# Patient Record
Sex: Female | Born: 1985
Health system: Southern US, Community
[De-identification: ages and names within clinical notes are randomized; demographics above are authoritative.]

## PROBLEM LIST (undated history)

## (undated) DIAGNOSIS — F419 Anxiety disorder, unspecified: Secondary | ICD-10-CM

## (undated) DIAGNOSIS — R102 Pelvic and perineal pain: Secondary | ICD-10-CM

## (undated) DIAGNOSIS — M79604 Pain in right leg: Secondary | ICD-10-CM

## (undated) DIAGNOSIS — N943 Premenstrual tension syndrome: Secondary | ICD-10-CM

## (undated) DIAGNOSIS — M79605 Pain in left leg: Secondary | ICD-10-CM

## (undated) HISTORY — DX: Pelvic and perineal pain: R10.2

## (undated) HISTORY — DX: Pain in left leg: M79.605

## (undated) HISTORY — DX: Anxiety disorder, unspecified: F41.9

## (undated) HISTORY — PX: BREAST ENHANCEMENT SURGERY: SHX7

## (undated) HISTORY — DX: Pain in right leg: M79.604

## (undated) HISTORY — DX: Premenstrual tension syndrome: N94.3

---
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
## 2006-02-26 ENCOUNTER — Ambulatory Visit (HOSPITAL_COMMUNITY): Admission: AD | Admit: 2006-02-26 | Discharge: 2006-02-26 | Payer: Self-pay | Admitting: Obstetrics and Gynecology

## 2006-03-02 ENCOUNTER — Ambulatory Visit (HOSPITAL_COMMUNITY): Admission: AD | Admit: 2006-03-02 | Discharge: 2006-03-02 | Payer: Self-pay | Admitting: Obstetrics and Gynecology

## 2006-05-13 ENCOUNTER — Inpatient Hospital Stay (HOSPITAL_COMMUNITY): Admission: AD | Admit: 2006-05-13 | Discharge: 2006-05-14 | Payer: Self-pay | Admitting: Obstetrics and Gynecology

## 2007-10-07 ENCOUNTER — Other Ambulatory Visit: Admission: RE | Admit: 2007-10-07 | Discharge: 2007-10-07 | Payer: Self-pay | Admitting: Obstetrics and Gynecology

## 2009-01-27 ENCOUNTER — Ambulatory Visit: Payer: Self-pay | Admitting: Family Medicine

## 2009-01-27 ENCOUNTER — Inpatient Hospital Stay (HOSPITAL_COMMUNITY): Admission: AD | Admit: 2009-01-27 | Discharge: 2009-01-31 | Payer: Self-pay | Admitting: Obstetrics & Gynecology

## 2009-05-10 ENCOUNTER — Other Ambulatory Visit: Admission: RE | Admit: 2009-05-10 | Discharge: 2009-05-10 | Payer: Self-pay | Admitting: Obstetrics and Gynecology

## 2010-09-17 LAB — HEPATITIS PANEL, ACUTE
Hep B C IgM: NEGATIVE
Hepatitis B Surface Ag: NEGATIVE

## 2010-09-17 LAB — COMPREHENSIVE METABOLIC PANEL
ALT: 42 U/L — ABNORMAL HIGH (ref 0–35)
ALT: 88 U/L — ABNORMAL HIGH (ref 0–35)
AST: 32 U/L (ref 0–37)
AST: 74 U/L — ABNORMAL HIGH (ref 0–37)
Albumin: 1.8 g/dL — ABNORMAL LOW (ref 3.5–5.2)
Albumin: 2.7 g/dL — ABNORMAL LOW (ref 3.5–5.2)
Alkaline Phosphatase: 161 U/L — ABNORMAL HIGH (ref 39–117)
BUN: 10 mg/dL (ref 6–23)
BUN: 11 mg/dL (ref 6–23)
BUN: 11 mg/dL (ref 6–23)
CO2: 20 mEq/L (ref 19–32)
CO2: 26 mEq/L (ref 19–32)
Calcium: 8.1 mg/dL — ABNORMAL LOW (ref 8.4–10.5)
Calcium: 8.5 mg/dL (ref 8.4–10.5)
Calcium: 8.9 mg/dL (ref 8.4–10.5)
Chloride: 108 mEq/L (ref 96–112)
Chloride: 108 mEq/L (ref 96–112)
Creatinine, Ser: 0.62 mg/dL (ref 0.4–1.2)
Creatinine, Ser: 0.82 mg/dL (ref 0.4–1.2)
GFR calc Af Amer: >60 mL/min (ref >60)
GFR calc Af Amer: >60 mL/min (ref >60)
GFR calc non Af Amer: >60 mL/min (ref >60)
GFR calc non Af Amer: >60 mL/min (ref >60)
Glucose, Bld: 77 mg/dL (ref 70–99)
Glucose, Bld: 90 mg/dL (ref 70–99)
Potassium: 3.3 mEq/L — ABNORMAL LOW (ref 3.5–5.1)
Sodium: 129 mEq/L — ABNORMAL LOW (ref 135–145)
Sodium: 137 mEq/L (ref 135–145)
Sodium: 140 mEq/L (ref 135–145)
Total Bilirubin: 0.1 mg/dL — ABNORMAL LOW (ref 0.3–1.2)
Total Bilirubin: 0.2 mg/dL — ABNORMAL LOW (ref 0.3–1.2)
Total Protein: 4.3 g/dL — ABNORMAL LOW (ref 6.0–8.3)

## 2010-09-17 LAB — HEPATIC FUNCTION PANEL
Albumin: 2.1 g/dL — ABNORMAL LOW (ref 3.5–5.2)
Indirect Bilirubin: 0.2 mg/dL — ABNORMAL LOW (ref 0.3–0.9)
Total Bilirubin: 0.3 mg/dL (ref 0.3–1.2)
Total Protein: 5.1 g/dL — ABNORMAL LOW (ref 6.0–8.3)

## 2010-09-17 LAB — CBC
HCT: 26 % — ABNORMAL LOW (ref 36.0–46.0)
HCT: 27.3 % — ABNORMAL LOW (ref 36.0–46.0)
Hemoglobin: 12.7 g/dL (ref 12.0–15.0)
Hemoglobin: 13 g/dL (ref 12.0–15.0)
Hemoglobin: 9.1 g/dL — ABNORMAL LOW (ref 12.0–15.0)
Hemoglobin: 9.4 g/dL — ABNORMAL LOW (ref 12.0–15.0)
MCHC: 34.1 g/dL (ref 30.0–36.0)
MCHC: 34.5 g/dL (ref 30.0–36.0)
MCHC: 34.9 g/dL (ref 30.0–36.0)
MCV: 95.4 fL (ref 78.0–100.0)
MCV: 96.4 fL (ref 78.0–100.0)
RBC: 2.68 MIL/uL — ABNORMAL LOW (ref 3.87–5.11)
RBC: 3.9 MIL/uL (ref 3.87–5.11)
RDW: 14.4 % (ref 11.5–15.5)
WBC: 8.9 10*3/uL (ref 4.0–10.5)
WBC: 9.7 10*3/uL (ref 4.0–10.5)

## 2010-09-17 LAB — RPR: RPR Ser Ql: NONREACTIVE

## 2010-09-17 LAB — MAGNESIUM: Magnesium: 4.5 mg/dL — ABNORMAL HIGH (ref 1.5–2.5)

## 2010-09-23 ENCOUNTER — Other Ambulatory Visit: Payer: Self-pay | Admitting: Adult Health

## 2010-09-23 ENCOUNTER — Other Ambulatory Visit (HOSPITAL_COMMUNITY)
Admission: RE | Admit: 2010-09-23 | Discharge: 2010-09-23 | Disposition: A | Discharge status: Home / Self Care | Payer: BC Managed Care – PPO | Source: Ambulatory Visit | Attending: Obstetrics and Gynecology | Admitting: Obstetrics and Gynecology

## 2010-09-23 DIAGNOSIS — Z01419 Encounter for gynecological examination (general) (routine) without abnormal findings: Secondary | ICD-10-CM | POA: Insufficient documentation

## 2010-10-25 NOTE — Op Note (Signed)
NAMEHEMA, Paige Johnson                  ACCOUNT NO.:  [id number]   MEDICAL RECORD NO.:  [id number]          PATIENT TYPE:  INP   LOCATION:  9124                          FACILITY:  [facility]   PHYSICIAN:  Catalina Antigua, MD     DATE OF BIRTH:  January 13, 1986   DATE OF PROCEDURE:  01/27/2009  DATE OF DISCHARGE:                               OPERATIVE REPORT   PREOPERATIVE DIAGNOSIS:  A 25 year old, para 1-0-2-1 at 41 weeks and 3  days for a primary cesarean section secondary to failure to progress at  8 cm, 90% phase, and -1 station.   POSTOPERATIVE DIAGNOSIS:  A 25 year old, para 1-0-2-1 at 41 weeks and 3  days for a primary cesarean section secondary to failure to progress at  8 cm, 90% phase and -1 station.   PROCEDURE:  A primary low transverse cesarean section.   SURGEON:  Catalina Antigua, MD   ASSISTANT:  Shelbie Proctor. Shawnie Pons, MD   ANESTHESIA:  Epidural.   ESTIMATED BLOOD LOSS:  600 mL.   IV FLUIDS:  2900 mL.   URINE OUTPUT:  200 mL.   FINDINGS:  Viable female infant born from OT position, Apgars 8 and 8 and  then of weight is 10 pounds 5 ounces.   COMPLICATIONS:  None.   SPECIMEN:  Placenta and it was sent to L and D for disposition.   PROCEDURE:  After informed consent was obtained, the patient was taken  to the operating room where anesthesia was induced and found to be  adequate.  The patient was prepped and draped in usual sterile fashion  and placed in dorsal spine position with leftward tilt.  A Pfannenstiel  skin incision was made with a scalpel and carried down to the underlying  layer of fascia with the Bovie.  Fascia was incised in the midline  incision and extended laterally with Mayo scissors.  The inferior aspect  of fascial incision was grasped with Kocher clamps, tented up and the  overlying rectus muscles dissected off.  Attention was then turned  superior aspect of the fascial incision in similar fashion, was grasped,  tented up with Kocher clamps and the underlying  rectus muscles dissected  off.  The rectus muscle was separated in the midline. The peritoneum  identified and entered bluntly.  This incision was extended superior and  inferiorly with good visualization of the bladder.  The eclips was  inserted into the intra-abdominal cavity.  The vesicouterine peritoneum  was identified, grasped with pickups and entered sharply with Metzenbaum  scissors.  This incision was extended laterally and the bladder flap  created digitally.  The lower uterine segment was then incised in a  transverse fashion with a scalpel.  This incision was extended laterally  with the bandage scissors.  The infant's head was delivered  atraumatically, the nose and mouth were bulb suctioned and the cord  clamped and cut.  The infant was handed to the waiting pediatrician.  The placenta was then delivered via gentle uterine massage.  The uterus  was cleared of all clots and debris.  The uterine  incision was repaired  with 0 Vicryl in a running locked fashion.  Excellent hemostasis was  noted.  The gutters were cleared of all clots and debris.  Copious  irrigation of the intra-abdominal cavity was then performed.  The eclips  was removed from the abdominal cavity. The fascia was reapproximated  with 0 Vicryl in a running fashion and then skin was closed with  staples.  The patient tolerated the procedure well.  Sponge, lap, and  needle count were correct x2.  The patient was transferred to recovery  room in stable condition.      Catalina Antigua, MD  Electronically Signed     PC/MEDQ  D:  01/28/2009  T:  01/29/2009  Job:  [id number]

## 2010-10-28 NOTE — Op Note (Signed)
NAMERoyston Cowper Johnson                ACCOUNT NO.:  [id number]   MEDICAL RECORD NO.:  [id number]          PATIENT TYPE:  INP   LOCATION:  A401                          FACILITY:  [facility]   PHYSICIAN:  Lazaro Arms, M.D.   DATE OF BIRTH:  10/13/1985   DATE OF PROCEDURE:  05/13/2006  DATE OF DISCHARGE:                               OPERATIVE REPORT   PROCEDURE PERFORMED:  Epidural catheter placement.   INDICATIONS FOR PROCEDURE:  Paige Johnson is a 25 year old white female gravida  3, para 0, abortus 2, estimated date of delivery of May 24, 2006  currently at 38-1/[redacted] weeks gestation, who presented to labor and delivery  complaining of regular contractions and rupture of membranes.  Rupture  of membranes was confirmed.  She was at fingertip, 50% and -2.  She has  progressed now to 4 cm, completely thinned out and 0 station.  She has  had two doses of IV pain medicine and is requesting an epidural be  placed.   Paige Johnson is placed in sitting position.  Betadine prep was used, 1%  lidocaine was injected into the L3-L4 interspace as local anesthetic.  The area is field draped.  A 17 gauge Tuohy needle was used.  The loss  of resistance technique is employed and the epidural space is found with  one pass without difficulty.  10 mL of 0.125% bupivacaine plain was  given as a test dose without ill effects.  The epidural catheter was  then fed without difficulty into the epidural space.  It was taped down  5 cm in the epidural space.  An additional 10 mL of 0.125% bupivacaine  plain was given and the continuous infusion was begun at 12 mL an hour.  The patient tolerated the procedure well. She is experiencing no adverse  symptoms.  Fetal heart rate tracing is stable.      Lazaro Arms, M.D.  Electronically Signed     LHE/MEDQ  D:  05/13/2006  T:  05/14/2006  Job:  [id number]

## 2010-10-28 NOTE — Op Note (Signed)
NAMERoyston Johnson Stefani                ACCOUNT NO.:  [id number]   MEDICAL RECORD NO.:  [id number]          PATIENT TYPE:  INP   LOCATION:  A401                          FACILITY:  [facility]   PHYSICIAN:  Tilda Burrow, M.D. DATE OF BIRTH:  [birth year]   DATE OF PROCEDURE:  05/13/2006  DATE OF DISCHARGE:                               OPERATIVE REPORT   OPERATION PERFORMED:  Vaginal delivery, 11:05 a.m.   LABOR SUMMARY:  Paige Johnson progressed nicely after Dr. Forestine Chute epidural.  She _ progressed over an hour to completely dilated and crowning.  I  performed perineal massage to stretch the perineal tissues and then she  was able to expel the baby over a small first degree perineal laceration  that was irregular, delivering a 7 pound, 9 ounce female, Apgars 9 and  9.  There was no nuchal cord.   The left shoulder was somewhat impacted under the symphysis pubis but  the right shoulder had already been released into the posterior vaginal  vault.  Corkscrew maneuver was performed spinning the baby around and  releasing the right arm first.  The delivery was easily accomplished  delivering a healthy 7 pound 9 ounce female, Apgars 9 and 9.  Placenta  cord was clamped and baby placed on maternal abdomen, then subsequently  dried under the warmer.  The patient had easy expulsion of the placenta  within moments of the delivery of the baby, with intact cord and  placenta and membranes.  The estimated blood 250 mL.  A single stitch of  2-0 Vicryl was placed in the posterior fourchette where the small first  degree laceration occurred and some irregular tissues from the  hymen  remnants were trimmed.  The patient tolerated the procedure well.  There  were four people in the delivery room.      Tilda Burrow, M.D.  Electronically Signed     JVF/MEDQ  D:  05/13/2006  T:  05/14/2006  Job:  [id number]

## 2010-10-28 NOTE — Discharge Summary (Signed)
NAMERoyston Cowper Johnson                ACCOUNT NO.:  [id number]   MEDICAL RECORD NO.:  [id number]          PATIENT TYPE:  OIB   LOCATION:  LDR2                          [facility]ILITY:  [facility]   PHYSICIAN:  Tilda Burrow, M.D. DATE OF BIRTH:  Apr 09, 1986   DATE OF ADMISSION:  02/26/2006  DATE OF DISCHARGE:  09/17/2007LH                                 DISCHARGE SUMMARY   REASON FOR ADMISSION:  Pregnancy at 27 weeks with low abdominal pain.   HISTORY OF PRESENT ILLNESS:  Paige Johnson is a 25 year old, G3, P0 who presents  with lower abdominal pain since last p.m.   PAST MEDICAL HISTORY:  Negative.   PAST SURGICAL HISTORY:  Ectopic pregnancy.   ALLERGIES:  No known drug allergies.   PRENATAL COURSE:  Essentially uneventful up until this point.   PRENATAL LABORATORY DATA:  Urine shows positive nitrites, positive wbc's.  There is no contraction pattern noted or palpated on the electronic fetal  monitor.   PHYSICAL EXAMINATION:  PELVIC:  Cervix is closed, posterior and high.   PLAN:  We are going to discharge her home on Macrobid b.i.d. x10 days and  Lortab 5/500 for the discomfort.  She knows if she has any regular  contractions or cramping, she is to return to see [place] or come back to the  hospital.      Paige Johnson, N.M.      Tilda Burrow, M.D.  Electronically Signed    DL/MEDQ  D:  16/03/9603  T:  02/26/2006  Job:  [id number]   cc:   [facility]

## 2010-10-28 NOTE — H&P (Signed)
NAMERoyston Cowper Tess                ACCOUNT NO.:  [id number]   MEDICAL RECORD NO.:  [id number]          PATIENT TYPE:  INP   LOCATION:  LDR2                          FACILITY:  [facility]   PHYSICIAN:  Lazaro Arms, M.D.   DATE OF BIRTH:  05/14/1986   DATE OF ADMISSION:  05/13/2006  DATE OF DISCHARGE:  LH                              HISTORY & PHYSICAL   Paige Johnson is a 24 year old white female gravida 3, para 0, abortus 2,  estimated date of delivery of May 24, 2006, currently 38-1/[redacted] weeks  gestation.  She is a transfer from Dr. __________ at 24 weeks.  We  initially saw her on February 05, 2006.  Her antepartum course has been  unremarkable and she presents complaining of regular uterine  contractions and ruptured membranes.  Ruptured membranes was confirmed.  She is fingertip at time of admission and has now progressed to 4 cm.   PAST MEDICAL HISTORY:  Negative.   PAST SURGICAL HISTORY:  She had an ectopic pregnancy.   ALLERGIES:  NONE.   MEDICATIONS:  Prenatal vitamins.   REVIEW OF SYSTEMS:  Negative.  She is A positive, Rubella immune.  Hepatitis B is negative, HIV is nonreactive, HSV is negative, serology  is nonreactive x2.  Pap is normal.  GC and Chlamydia were normal and  repeat was as well.  Group B strep was negative.  AFP was normal,  Glucola was 99.   IMPRESSION:  1. Intrauterine pregnancy at 38-1/[redacted] weeks gestation.  2. Ruptured membranes with labor.   PLAN:  Patient is admitted for labor management.  She is requesting an  epidural.      Lazaro Arms, M.D.  Electronically Signed     LHE/MEDQ  D:  05/13/2006  T:  05/13/2006  Job:  [id number]

## 2010-10-28 NOTE — Op Note (Signed)
NAMERoyston Cowper Johnson                ACCOUNT NO.:  [id number]   MEDICAL RECORD NO.:  [id number]          PATIENT TYPE:  MAT   LOCATION:  MATC                          [facility]   PHYSICIAN:  Paige Johnson, M.D.  DATE OF BIRTH:  30-Aug-1985   DATE OF PROCEDURE:  12/08/2004  DATE OF DISCHARGE:                                 OPERATIVE REPORT   PREOPERATIVE DIAGNOSIS:  Right lower quadrant pain, probable ectopic  pregnancy.   POSTOPERATIVE DIAGNOSIS:  Right ectopic pregnancy, unruptured.   PROCEDURE:  Open laparoscopy, right salpingostomy.   SURGEON:  Dr. Marina Johnson   ASSISTANT:  Dr. Richardean Johnson   FINDINGS:  Unruptured right mid tubal pregnancy hemoperitoneum.   ANESTHESIA:  General.   SPECIMENS:  Products of conception.   COMPLICATIONS:  None.   BLOOD LOSS:  Minimal, although approximately 75 mL of blood in the pelvis at  the time of initial entry.   INDICATION:  Patient with a recent history of first trimester bleeding,  proved to be Rh positive in the office.  Initially her quantitative hCG  level rose appropriately; however, the most recent two had shown an  inappropriate rise at less than 50%.  Her most recent was 435 today and two  days prior was 344.  Ultrasound showed a right adnexal mass and blood in the  pelvis which appeared to be a substantial amount.  The patient was found to  be hemodynamically stable without signs of anemia; however, given her  increasing pain and the ultrasound findings, this was worrisome for a  potentially ruptured ectopic pregnancy or at least one with significant  bleeding that would therefore make her not a good candidate for  methotrexate.  The patient was advised of the risks of surgery, including  infection, bleeding, damage to surrounding organs, and potential need for  tubal removal or laparotomy.   DESCRIPTION OF PROCEDURE:  The patient was taken to the operating room and  general anesthesia obtained.  She was prepped  and draped in a standard  fashion, had Foley catheter placed in the bladder, sponge-stick placed in  the vagina.   A 10 mm incision placed in the umbilicus transversely.  Sharply dissected to  the fascia.  The fascia was divided sharply and elevated with the Kocher  clamps.  Posterior sheath and peritoneum were elevated and entered sharply.  Pursestring suture of 0 Vicryl placed around the fascial defect, a Hasson  cannula inserted and secured.   Pneumoperitoneum obtained with CO2 gas.  Trendelenberg position obtained.  Then 5 mm ports were placed in each lower quadrant under direct laparoscopic  visualization.  Pelvis was inspected and the above findings noted.  The  pelvis was irrigated to evacuate the hemoperitoneum.  There was no active  bleeding.   The tube itself appeared normal on the right, although there was an obvious  swelling in its mid portion consistent with an ectopic pregnancy.  The  fimbria appeared normal as did the ovary as did the left tube and ovary,  uterus, anterior and posterior cul-de-sac, and upper abdomen.  There were  no  signs of any type of scarring or other pathology.   The right tube was grasped and placed against the uterus.  The  antimesenteric border was entered with the needle point cautery for linear  salpingostomy.  The ectopic pregnancy tissue was grasped and removed.  Hydrodissection performed.  No additional material could be removed from the  tubial ostia; however, at the fimbria, it was noted there was some  additional tissue which flushed out during the hydrodissection which was  also removed.   The salpingostomy was made hemostatic with bipolar cautery.   No other abnormalities were noted, and the procedure was therefore  terminated.  The inferior ports were removed and their sites inspected  laparoscopically and found to be hemostatic.   The scope was removed, gas released, and Hasson cannula removed.  The  incision was elevated with  [employer] retractors and the fascial defect  closed with the previously-placed pursestring suture.  The skin was closed  at each lower port with Dermabond.  The umbilical incision could not be  closed with Dermabond, as there a small amount of bleeding from the skin  edge.  It was therefore closed with a running subcuticular stitch of 4-0  Vicryl.   The patient tolerated the procedure well with no complications.  She was  taken to the recovery room in a stable condition.  The patient will be  advised in the recovery room that she will need to have follow up  quantitative hCGs to ensure complete resolution of the ectopic pregnancy.  Arrangements will be made for this as an outpatient in our office.       WBD/MEDQ  D:  12/08/2004  T:  12/08/2004  Job:  [id number]

## 2013-03-10 ENCOUNTER — Ambulatory Visit (INDEPENDENT_AMBULATORY_CARE_PROVIDER_SITE_OTHER): Payer: BC Managed Care – PPO | Admitting: Obstetrics & Gynecology

## 2013-03-10 ENCOUNTER — Encounter: Payer: Self-pay | Admitting: Obstetrics & Gynecology

## 2013-03-10 VITALS — BP 110/60 | Ht 65.0 in | Wt 167.5 lb

## 2013-03-10 DIAGNOSIS — M545 Low back pain, unspecified: Secondary | ICD-10-CM

## 2013-03-10 DIAGNOSIS — M5380 Other specified dorsopathies, site unspecified: Secondary | ICD-10-CM

## 2013-03-10 DIAGNOSIS — M62830 Muscle spasm of back: Secondary | ICD-10-CM | POA: Insufficient documentation

## 2013-03-10 LAB — POCT URINALYSIS DIPSTICK
Blood, UA: NEGATIVE
Glucose, UA: NEGATIVE
Nitrite, UA: NEGATIVE

## 2013-03-10 MED ORDER — CYCLOBENZAPRINE HCL 10 MG PO TABS: 10.0000 mg | ORAL_TABLET | Freq: Three times a day (TID) | ORAL | Status: DC | PRN

## 2013-03-10 NOTE — Progress Notes (Signed)
Patient ID: Paige Johnson, female   DOB: 12-12-1985, 27 y.o.   MRN: [id number] Pt with significant paraspinous muscle spasm on left, originating around the post ischial spine Also left rhomboid and trapezius  Injected with 0.5 %marcaine with much relief Recommend new pillow and  Follow up in 1 week

## 2013-03-17 ENCOUNTER — Ambulatory Visit (INDEPENDENT_AMBULATORY_CARE_PROVIDER_SITE_OTHER): Payer: BC Managed Care – PPO | Admitting: Obstetrics & Gynecology

## 2013-03-17 ENCOUNTER — Encounter: Payer: Self-pay | Admitting: Obstetrics & Gynecology

## 2013-03-17 VITALS — BP 114/70 | Wt 164.0 lb

## 2013-03-17 DIAGNOSIS — M62830 Muscle spasm of back: Secondary | ICD-10-CM

## 2013-03-17 DIAGNOSIS — M545 Low back pain, unspecified: Secondary | ICD-10-CM

## 2013-03-17 DIAGNOSIS — M5380 Other specified dorsopathies, site unspecified: Secondary | ICD-10-CM

## 2013-03-17 NOTE — Progress Notes (Signed)
Patient ID: Paige Johnson, female   DOB: 1985-10-05, 27 y.o.   MRN: [id number] Pt is much much better No triggers today  Follow up prn

## 2013-08-28 ENCOUNTER — Other Ambulatory Visit: Payer: BC Managed Care – PPO | Admitting: Adult Health

## 2013-09-04 ENCOUNTER — Ambulatory Visit (INDEPENDENT_AMBULATORY_CARE_PROVIDER_SITE_OTHER): Payer: BC Managed Care – PPO | Admitting: Adult Health

## 2013-09-04 ENCOUNTER — Other Ambulatory Visit (HOSPITAL_COMMUNITY)
Admission: RE | Admit: 2013-09-04 | Discharge: 2013-09-04 | Disposition: A | Discharge status: Home / Self Care | Payer: BC Managed Care – PPO | Source: Ambulatory Visit | Attending: Obstetrics and Gynecology | Admitting: Obstetrics and Gynecology

## 2013-09-04 ENCOUNTER — Encounter (INDEPENDENT_AMBULATORY_CARE_PROVIDER_SITE_OTHER): Payer: Self-pay

## 2013-09-04 ENCOUNTER — Encounter: Payer: Self-pay | Admitting: Adult Health

## 2013-09-04 VITALS — BP 94/60 | HR 76 | Ht 65.0 in | Wt 164.0 lb

## 2013-09-04 DIAGNOSIS — Z01419 Encounter for gynecological examination (general) (routine) without abnormal findings: Secondary | ICD-10-CM

## 2013-09-04 NOTE — Progress Notes (Signed)
Patient ID: Paige Johnson, female   DOB: December 16, 1985, 28 y.o.   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   Current Medications, Allergies, Past Medical History, Past Surgical History, Family History and Social History were reviewed in [facility] electronic medical record.     Review of Systems: Patient denies any headaches, blurred vision, shortness of breath, chest pain, abdominal pain, problems with bowel movements, urination, or intercourse. Does have some pressure with sex, discussed increase foreplay and position change and lubrication.No joint pain, does have trouble focusing at times.    Physical Exam:BP 94/60  Pulse 76  Ht 5\' 5"  (1.651 m)  Wt 164 lb (74.39 kg)  BMI 27.29 kg/m2  LMP 08/26/2013 General:  Well developed, well nourished, no acute distress Skin:  Warm and dry Neck:  Midline trachea, normal thyroid Lungs; Clear to auscultation bilaterally Breast:  No dominant palpable mass, retraction, or nipple discharge Cardiovascular: Regular rate and rhythm Abdomen:  Soft, non tender, no hepatosplenomegaly Pelvic:  External genitalia is normal in appearance.  The vagina is normal in appearance.  The cervix is bulbous. Pap performed. Uterus is felt to be normal size, shape, and contour.  No  adnexal masses or tenderness noted. Extremities:  No swelling or varicosities noted Psych:  No mood changes, alert and cooperative,seems happy   Impression: Yearly gyn exam    Plan: Physical in 1 year Get flu shot and TB at PCP

## 2013-09-04 NOTE — Patient Instructions (Signed)
Physical in 1 year Get flu shot and TB

## 2013-09-25 ENCOUNTER — Encounter: Payer: BC Managed Care – PPO | Admitting: Nurse Practitioner

## 2013-10-01 ENCOUNTER — Ambulatory Visit (INDEPENDENT_AMBULATORY_CARE_PROVIDER_SITE_OTHER): Payer: BC Managed Care – PPO | Admitting: Nurse Practitioner

## 2013-10-01 ENCOUNTER — Encounter: Payer: Self-pay | Admitting: Nurse Practitioner

## 2013-10-01 VITALS — BP 112/80 | Ht 65.0 in | Wt 162.0 lb

## 2013-10-01 DIAGNOSIS — Z0189 Encounter for other specified special examinations: Secondary | ICD-10-CM

## 2013-10-01 DIAGNOSIS — Z111 Encounter for screening for respiratory tuberculosis: Secondary | ICD-10-CM

## 2013-10-01 DIAGNOSIS — J310 Chronic rhinitis: Secondary | ICD-10-CM

## 2013-10-01 DIAGNOSIS — J329 Chronic sinusitis, unspecified: Secondary | ICD-10-CM

## 2013-10-01 MED ORDER — AZITHROMYCIN 250 MG PO TABS: ORAL_TABLET | ORAL | Status: DC

## 2013-10-02 LAB — VARICELLA ZOSTER ANTIBODY, IGG: Varicella IgG: 528.5 Index — ABNORMAL HIGH (ref <135.00)

## 2013-10-03 ENCOUNTER — Ambulatory Visit (INDEPENDENT_AMBULATORY_CARE_PROVIDER_SITE_OTHER): Payer: BC Managed Care – PPO | Admitting: Nurse Practitioner

## 2013-10-03 ENCOUNTER — Encounter: Payer: Self-pay | Admitting: Nurse Practitioner

## 2013-10-03 VITALS — BP 110/70 | Ht 65.0 in | Wt 162.0 lb

## 2013-10-03 DIAGNOSIS — Z79899 Other long term (current) drug therapy: Secondary | ICD-10-CM

## 2013-10-03 DIAGNOSIS — F988 Other specified behavioral and emotional disorders with onset usually occurring in childhood and adolescence: Secondary | ICD-10-CM

## 2013-10-03 LAB — TB SKIN TEST
Induration: 0 mm
TB Skin Test: NEGATIVE

## 2013-10-03 MED ORDER — AMPHETAMINE-DEXTROAMPHETAMINE 10 MG PO TABS: ORAL_TABLET | ORAL | Status: DC

## 2013-10-05 ENCOUNTER — Encounter: Payer: Self-pay | Admitting: Nurse Practitioner

## 2013-10-05 DIAGNOSIS — F988 Other specified behavioral and emotional disorders with onset usually occurring in childhood and adolescence: Secondary | ICD-10-CM | POA: Insufficient documentation

## 2013-10-05 NOTE — Progress Notes (Signed)
Subjective:  Presents with paperwork for her physical at school. Complaints of sinus pressure, head congestion with occasional cough. Producing yellowish mucus. No fever. No sore throat. No ear pain. No wheezing. Had a preventive health physical on 3/26.   Objective:   BP 112/80  Ht 5\' 5"  (1.651 m)  Wt 162 lb (73.483 kg)  BMI 26.96 kg/m2  LMP 09/22/2013 NAD. Alert, oriented. TMs clear effusion, no erythema. Pharynx injected with greenish PND noted. Neck supple with mild soft anterior adenopathy. Lungs clear. Heart regular rate rhythm. See physical form for remainder of exam.  Assessment: Rhinosinusitis  Visit for TB skin test - Plan: TB Skin Test  Other specified examination - Plan: Varicella zoster antibody, IgG, CANCELED: Varicella Zoster Abs, IgG/IgM, CANCELED: Varicella Zoster Abs, IgG/IgM  Plan: Z-Pak as directed. OTC meds as directed for congestion. At end of visit patient mentions that she's been having some significant difficulty focusing which has been a long-term problem. Given a copy of ADD questionnaire for her to fill out at home and schedule visit to discuss further.

## 2013-10-05 NOTE — Progress Notes (Addendum)
Subjective:  Presents to discuss symptoms of ADD. Has had problems since childhood. Extreme difficulty focusing, easily distracted. Currently in school, her teachers have noticed her difficulty focusing and made comments. Works a half a day, goes to school in the evenings. Near the end of May patient will begin her clinicals which will go all day. No history of any heart issues.  Objective:   BP 110/70  Ht 5\' 5"  (1.651 m)  Wt 162 lb (73.483 kg)  BMI 26.96 kg/m2  LMP 09/22/2013 NAD. Alert, oriented. Lungs clear. Heart regular rhythm. ECG normal.  Assessment: Problem List Items Addressed This Visit   None    Visit Diagnoses   ADD (attention deficit disorder)    -  Primary    Relevant Medications       amphetamine-dextroamphetamine (ADDERALL) tablet    Other Relevant Orders       PR ELECTROCARDIOGRAM, COMPLETE    High risk medication use        Relevant Orders       PR ELECTROCARDIOGRAM, COMPLETE       Plan: Meds ordered this encounter  Medications  . amphetamine-dextroamphetamine (ADDERALL) 10 MG tablet    Sig: One po qd; do not take after 4 pm    Dispense:  30 tablet    Refill:  0    Order Specific Question:  Supervising Provider    Answer:  Merlyn AlbertLUKING, WILLIAM S [[id number]]  trial of Adderall 10 mg in the afternoon. Discussed potential adverse effects. DC med and call if any problems. May need to change regimen once she begins her clinicals. Return in about 1 month (around 11/02/2013). Call back sooner if any problems.

## 2013-10-08 ENCOUNTER — Ambulatory Visit: Payer: BC Managed Care – PPO

## 2013-10-15 ENCOUNTER — Ambulatory Visit (INDEPENDENT_AMBULATORY_CARE_PROVIDER_SITE_OTHER): Payer: BC Managed Care – PPO

## 2013-10-15 DIAGNOSIS — Z111 Encounter for screening for respiratory tuberculosis: Secondary | ICD-10-CM

## 2013-10-17 LAB — TB SKIN TEST: TB SKIN TEST: NEGATIVE

## 2013-10-28 ENCOUNTER — Encounter: Payer: Self-pay | Admitting: Nurse Practitioner

## 2013-10-29 ENCOUNTER — Encounter: Payer: Self-pay | Admitting: Nurse Practitioner

## 2013-10-29 ENCOUNTER — Ambulatory Visit (INDEPENDENT_AMBULATORY_CARE_PROVIDER_SITE_OTHER): Payer: BC Managed Care – PPO | Admitting: Nurse Practitioner

## 2013-10-29 VITALS — BP 116/74 | Ht 65.0 in | Wt 164.0 lb

## 2013-10-29 DIAGNOSIS — F988 Other specified behavioral and emotional disorders with onset usually occurring in childhood and adolescence: Secondary | ICD-10-CM

## 2013-10-29 DIAGNOSIS — J310 Chronic rhinitis: Secondary | ICD-10-CM

## 2013-10-29 MED ORDER — AMPHETAMINE-DEXTROAMPHET ER 15 MG PO CP24: 15.0000 mg | ORAL_CAPSULE | ORAL | Status: DC

## 2013-10-29 MED ORDER — AMPHETAMINE-DEXTROAMPHETAMINE 10 MG PO TABS: ORAL_TABLET | ORAL | Status: DC

## 2013-10-29 NOTE — Patient Instructions (Signed)
OTC antihistamine as directed (Loratadine or fexofenadine) Nasacort AQ as directed

## 2013-10-31 ENCOUNTER — Ambulatory Visit: Payer: BC Managed Care – PPO | Admitting: Nurse Practitioner

## 2013-11-02 ENCOUNTER — Encounter: Payer: Self-pay | Admitting: Nurse Practitioner

## 2013-11-02 NOTE — Progress Notes (Signed)
Subjective:  Presents for recheck. ADD improving. Will begin clinical in a few weeks which lasts 8-5. Denies any adverse effects from Adderall. No CP, SOB, palpitations or insomnia. Also mild head congestion. Rare cough. No fever. Clear drainage. No sore throat, headache or ear pain.   Objective:   BP 116/74  Ht 5\' 5"  (1.651 m)  Wt 164 lb (74.39 kg)  BMI 27.29 kg/m2  LMP 09/22/2013 NAD. Alert, oriented. TMs clear effusion. Pharynx injected with clear PND. Neck supple with mild soft adenopathy. Lungs clear. Heart RRR.  Assessment:  Problem List Items Addressed This Visit     Other   ADD (attention deficit disorder) - Primary   Relevant Medications      amphetamine-dextroamphetamine (ADDERALL) tablet    Other Visit Diagnoses   Rhinitis          Plan:  Given 3 separate monthly prescriptions for Adderall XR 15 mg and adderall 10 mg. Recheck in 3 months, call back sooner if problems.  OTC meds as directed for congestion.

## 2014-04-13 ENCOUNTER — Encounter: Payer: Self-pay | Admitting: Nurse Practitioner

## 2014-08-31 ENCOUNTER — Other Ambulatory Visit: Payer: Self-pay | Admitting: Nurse Practitioner

## 2014-08-31 MED ORDER — ESCITALOPRAM OXALATE 10 MG PO TABS: 10.0000 mg | ORAL_TABLET | Freq: Every day | ORAL | Status: DC

## 2014-08-31 NOTE — Progress Notes (Signed)
Has stopped Adderall due to palpitations. Wants to try med for anxiety. No relief with Celexa in the past. In with daughter today. To schedule office visit in the next few weeks. DC Lexapro in the meantime and call if any problems.

## 2015-08-19 ENCOUNTER — Encounter: Payer: Self-pay | Admitting: Adult Health

## 2015-08-19 ENCOUNTER — Ambulatory Visit (INDEPENDENT_AMBULATORY_CARE_PROVIDER_SITE_OTHER): Payer: BLUE CROSS/BLUE SHIELD | Admitting: Adult Health

## 2015-08-19 VITALS — BP 110/60 | HR 66 | Ht 65.0 in | Wt 167.5 lb

## 2015-08-19 DIAGNOSIS — N63 Unspecified lump in breast: Secondary | ICD-10-CM

## 2015-08-19 DIAGNOSIS — N6310 Unspecified lump in the right breast, unspecified quadrant: Secondary | ICD-10-CM

## 2015-08-19 NOTE — Patient Instructions (Signed)
[facility] be there at 10:15 am  See me 3/20 for physical

## 2015-08-19 NOTE — Progress Notes (Signed)
Subjective:     Patient ID: Paige Johnson, female   DOB: 1986/02/12, 30 y.o.   MRN: [id number]  HPI Paige Johnson is a 30 year old white female in complaining of right breast mass for about 3 months, it is tender and seems smaller since period started.   Review of Systems +breast mass on right that is tender Patient denies any  Daily headaches, hearing loss, blurred vision, shortness of breath, chest pain, abdominal pain, problems with bowel movements, urination, or intercourse. No joint pain or mood swings.+fatigue and back and legs hurt with period has appt 3/20 for physical and to discuss further Reviewed past medical,surgical, social and family history. Reviewed medications and allergies.     Objective:   Physical Exam BP 110/60 mmHg  Pulse 66  Ht 5\' 5"  (1.651 m)  Wt 167 lb 8 oz (75.978 kg)  BMI 27.87 kg/m2  LMP 08/18/2015   Skin warm and dry,  Breasts:no dominate palpable mass, retraction or nipple discharge on left, on right, no retraction or nipple discharge, but has oval, mobile,tender mass at 11-1 0'clock, 2 FB from nipple    Assessment:     Right breast mass    Plan:     [facility] am Return 3/20 as scheduled for pap and physical

## 2015-08-24 ENCOUNTER — Ambulatory Visit (HOSPITAL_COMMUNITY)
Admission: RE | Admit: 2015-08-24 | Discharge: 2015-08-24 | Disposition: A | Discharge status: Home / Self Care | Payer: BLUE CROSS/BLUE SHIELD | Source: Ambulatory Visit | Attending: Adult Health | Admitting: Adult Health

## 2015-08-24 DIAGNOSIS — N6310 Unspecified lump in the right breast, unspecified quadrant: Secondary | ICD-10-CM

## 2015-08-24 DIAGNOSIS — N63 Unspecified lump in breast: Secondary | ICD-10-CM | POA: Diagnosis present

## 2015-08-30 ENCOUNTER — Encounter: Payer: Self-pay | Admitting: Adult Health

## 2015-08-30 ENCOUNTER — Ambulatory Visit (INDEPENDENT_AMBULATORY_CARE_PROVIDER_SITE_OTHER): Payer: BLUE CROSS/BLUE SHIELD | Admitting: Adult Health

## 2015-08-30 ENCOUNTER — Other Ambulatory Visit (HOSPITAL_COMMUNITY)
Admission: RE | Admit: 2015-08-30 | Discharge: 2015-08-30 | Disposition: A | Discharge status: Home / Self Care | Payer: BLUE CROSS/BLUE SHIELD | Source: Ambulatory Visit | Attending: Adult Health | Admitting: Adult Health

## 2015-08-30 VITALS — BP 112/72 | HR 64 | Ht 65.25 in | Wt 165.0 lb

## 2015-08-30 DIAGNOSIS — Z1151 Encounter for screening for human papillomavirus (HPV): Secondary | ICD-10-CM | POA: Insufficient documentation

## 2015-08-30 DIAGNOSIS — Z01419 Encounter for gynecological examination (general) (routine) without abnormal findings: Secondary | ICD-10-CM | POA: Insufficient documentation

## 2015-08-30 DIAGNOSIS — N943 Premenstrual tension syndrome: Secondary | ICD-10-CM

## 2015-08-30 DIAGNOSIS — R102 Pelvic and perineal pain: Secondary | ICD-10-CM | POA: Insufficient documentation

## 2015-08-30 DIAGNOSIS — M79605 Pain in left leg: Secondary | ICD-10-CM

## 2015-08-30 DIAGNOSIS — M79604 Pain in right leg: Secondary | ICD-10-CM

## 2015-08-30 DIAGNOSIS — F419 Anxiety disorder, unspecified: Secondary | ICD-10-CM

## 2015-08-30 HISTORY — DX: Pain in left leg: M79.605

## 2015-08-30 HISTORY — DX: Premenstrual tension syndrome: N94.3

## 2015-08-30 HISTORY — DX: Anxiety disorder, unspecified: F41.9

## 2015-08-30 HISTORY — DX: Pain in right leg: M79.604

## 2015-08-30 HISTORY — DX: Pelvic and perineal pain: R10.2

## 2015-08-30 MED ORDER — SERTRALINE HCL 50 MG PO TABS: 50.0000 mg | ORAL_TABLET | Freq: Every day | ORAL | Status: DC

## 2015-08-30 NOTE — Progress Notes (Signed)
Patient ID: Paige Johnson, female   DOB: 1985/12/10, 30 y.o.   MRN: [id number] History of Present Illness: Paige Johnson is a 30 year old white female, married in for well woman gyn exam and pap, and she is complaining of anxiety, is teary before teary and has short fuse.She also has pain in right side and can't lay on stomach since last delivery and sex hurts at times.She says legs ache a lot, worse with periods, she stands 12-14 hours a day too. She says she has tried meds for anxiety in past and they did not sem to help.  Current Medications, Allergies, Past Medical History, Past Surgical History, Family History and Social History were reviewed in [facility] electronic medical record.     Review of Systems: Patient denies any headaches, hearing loss, fatigue, blurred vision, shortness of breath, chest pain, problems with bowel movements, or urination. See HPI for positives.    Physical Exam:BP 112/72 mmHg  Pulse 64  Ht 5' 5.25" (1.657 m)  Wt 165 lb (74.844 kg)  BMI 27.26 kg/m2  LMP 08/18/2015 General:  Well developed, well nourished, no acute distress Skin:  Warm and dry Neck:  Midline trachea, normal thyroid, good ROM, no lymphadenopathy Lungs; Clear to auscultation bilaterally Breast:  No dominant palpable mass, retraction, or nipple discharge, has area right breast UOQ that was fibroglandular tissue on US last week Cardiovascular: Regular rate and rhythm Abdomen:  Soft, non tender, no hepatosplenomegaly Pelvic:  External genitalia is normal in appearance, no lesions.  The vagina is normal in appearance. Urethra has no lesions or masses. The cervix is bulbous.Pap with HPV performed.  Uterus is felt to be normal size, shape, and contour, and is tender neat C section scar.  No adnexal masses or tenderness noted.Bladder is mildly tender, no masses felt. Extremities/musculoskeletal:  No swelling or varicosities noted, no clubbing or cyanosis Psych:  No mood changes, alert and cooperative,seems  happy Discussed pain could be scar tissue, will get US to assess uterus, and will try zoloft for anxiety and PMS>  Impression: Well woman gyn exam with pap Anxiety  Pelvic pain Bilateral leg pain  PMS   Plan: Check CBC,CMP,TSH and lipids,A1c and vitamin D Rx zoloft 50 mg #30 take 1/2 tab for 4-5 days then take 1 tablet daily with 6 refills Return in 4 weeks for gyn US(her requested date) Physical in 1 year, pap in 3 if normal Mammogram at 40 See handout on pelvic pain

## 2015-08-30 NOTE — Patient Instructions (Signed)
Pelvic Pain, Female Female pelvic pain can be caused by many different things and start from a variety of places. Pelvic pain refers to pain that is located in the lower half of the abdomen and between your hips. The pain may occur over a short period of time (acute) or may be reoccurring (chronic). The cause of pelvic pain may be related to disorders affecting the female reproductive organs (gynecologic), but it may also be related to the bladder, kidney stones, an intestinal complication, or muscle or skeletal problems. Getting help right away for pelvic pain is important, especially if there has been severe, sharp, or a sudden onset of unusual pain. It is also important to get help right away because some types of pelvic pain can be life threatening.  CAUSES  Below are only some of the causes of pelvic pain. The causes of pelvic pain can be in one of several categories.   Gynecologic.  Pelvic inflammatory disease.  Sexually transmitted infection.  Ovarian cyst or a twisted ovarian ligament (ovarian torsion).  Uterine lining that grows outside the uterus (endometriosis).  Fibroids, cysts, or tumors.  Ovulation.  Pregnancy.  Pregnancy that occurs outside the uterus (ectopic pregnancy).  Miscarriage.  Labor.  Abruption of the placenta or ruptured uterus.  Infection.  Uterine infection (endometritis).  Bladder infection.  Diverticulitis.  Miscarriage related to a uterine infection (septic abortion).  Bladder.  Inflammation of the bladder (cystitis).  Kidney stone(s).  Gastrointestinal.  Constipation.  Diverticulitis.  Neurologic.  Trauma.  Feeling pelvic pain because of mental or emotional causes (psychosomatic).  Cancers of the bowel or pelvis. EVALUATION  Your caregiver will want to take a careful history of your concerns. This includes recent changes in your health, a careful gynecologic history of your periods (menses), and a sexual history. Obtaining  your family history and medical history is also important. Your caregiver may suggest a pelvic exam. A pelvic exam will help identify the location and severity of the pain. It also helps in the evaluation of which organ system may be involved. In order to identify the cause of the pelvic pain and be properly treated, your caregiver may order tests. These tests may include:   A pregnancy test.  Pelvic ultrasonography.  An X-ray exam of the abdomen.  A urinalysis or evaluation of vaginal discharge.  Blood tests. HOME CARE INSTRUCTIONS   Only take over-the-counter or prescription medicines for pain, discomfort, or fever as directed by your caregiver.   Rest as directed by your caregiver.   Eat a balanced diet.   Drink enough fluids to make your urine clear or pale yellow, or as directed.   Avoid sexual intercourse if it causes pain.   Apply warm or cold compresses to the lower abdomen depending on which one helps the pain.   Avoid stressful situations.   Keep a journal of your pelvic pain. Write down when it started, where the pain is located, and if there are things that seem to be associated with the pain, such as food or your menstrual cycle.  Follow up with your caregiver as directed.  SEEK MEDICAL CARE IF:  Your medicine does not help your pain.  You have abnormal vaginal discharge. SEEK IMMEDIATE MEDICAL CARE IF:   You have heavy bleeding from the vagina.   Your pelvic pain increases.   You feel light-headed or faint.   You have chills.   You have pain with urination or blood in your urine.   You have uncontrolled  diarrhea or vomiting.   You have a fever or persistent symptoms for more than 3 days.  You have a fever and your symptoms suddenly get worse.   You are being physically or sexually abused.   This information is not intended to replace advice given to you by your health care provider. Make sure you discuss any questions you have with  your health care provider.   Document Released: 04/25/2004 Document Revised: 02/17/2015 Document Reviewed: 09/18/2011 Elsevier Interactive Patient Education [employer]. Follow up in 4 weeks for US Physical in 1 year Take zoloft 1/2 tab for 4-5 days then 1 daily

## 2015-08-31 ENCOUNTER — Telehealth: Payer: Self-pay | Admitting: Adult Health

## 2015-08-31 LAB — LIPID PANEL
CHOL/HDL RATIO: 2.2 ratio (ref 0.0–4.4)
Cholesterol, Total: 152 mg/dL (ref 100–199)
HDL: 69 mg/dL (ref >39)
LDL Calculated: 76 mg/dL (ref 0–99)
Triglycerides: 33 mg/dL (ref 0–149)
VLDL Cholesterol Cal: 7 mg/dL (ref 5–40)

## 2015-08-31 LAB — CBC
HEMOGLOBIN: 13.4 g/dL (ref 11.1–15.9)
Hematocrit: 39.6 % (ref 34.0–46.6)
MCH: 31.5 pg (ref 26.6–33.0)
MCHC: 33.8 g/dL (ref 31.5–35.7)
MCV: 93 fL (ref 79–97)
Platelets: 331 10*3/uL (ref 150–379)
RBC: 4.25 x10E6/uL (ref 3.77–5.28)
RDW: 12.8 % (ref 12.3–15.4)
WBC: 5 10*3/uL (ref 3.4–10.8)

## 2015-08-31 LAB — COMPREHENSIVE METABOLIC PANEL
A/G RATIO: 2.2 (ref 1.2–2.2)
ALBUMIN: 4.6 g/dL (ref 3.5–5.5)
ALT: 22 IU/L (ref 0–32)
AST: 21 IU/L (ref 0–40)
Alkaline Phosphatase: 54 IU/L (ref 39–117)
BILIRUBIN TOTAL: 0.8 mg/dL (ref 0.0–1.2)
BUN / CREAT RATIO: 14 (ref 8–20)
BUN: 8 mg/dL (ref 6–20)
CHLORIDE: 101 mmol/L (ref 96–106)
CO2: 24 mmol/L (ref 18–29)
Calcium: 9.5 mg/dL (ref 8.7–10.2)
Creatinine, Ser: 0.56 mg/dL — ABNORMAL LOW (ref 0.57–1.00)
GFR calc non Af Amer: 126 mL/min/{1.73_m2} (ref >59)
GFR, EST AFRICAN AMERICAN: 146 mL/min/{1.73_m2} (ref >59)
Globulin, Total: 2.1 g/dL (ref 1.5–4.5)
Glucose: 80 mg/dL (ref 65–99)
POTASSIUM: 4.1 mmol/L (ref 3.5–5.2)
Sodium: 141 mmol/L (ref 134–144)
TOTAL PROTEIN: 6.7 g/dL (ref 6.0–8.5)

## 2015-08-31 LAB — HEMOGLOBIN A1C
Est. average glucose Bld gHb Est-mCnc: 111 mg/dL
HEMOGLOBIN A1C: 5.5 % (ref 4.8–5.6)

## 2015-08-31 LAB — TSH: TSH: 0.707 u[IU]/mL (ref 0.450–4.500)

## 2015-08-31 LAB — CYTOLOGY - PAP

## 2015-08-31 LAB — VITAMIN D 25 HYDROXY (VIT D DEFICIENCY, FRACTURES): Vit D, 25-Hydroxy: 32.1 ng/mL (ref 30.0–100.0)

## 2015-08-31 NOTE — Telephone Encounter (Signed)
Left message labs normal

## 2015-10-01 ENCOUNTER — Other Ambulatory Visit: Payer: BLUE CROSS/BLUE SHIELD

## 2016-03-29 DIAGNOSIS — Z419 Encounter for procedure for purposes other than remedying health state, unspecified: Secondary | ICD-10-CM | POA: Diagnosis not present

## 2016-06-01 DIAGNOSIS — D224 Melanocytic nevi of scalp and neck: Secondary | ICD-10-CM | POA: Diagnosis not present

## 2016-07-03 ENCOUNTER — Encounter: Payer: Self-pay | Admitting: Adult Health

## 2016-07-03 ENCOUNTER — Ambulatory Visit (INDEPENDENT_AMBULATORY_CARE_PROVIDER_SITE_OTHER): Payer: BLUE CROSS/BLUE SHIELD | Admitting: Adult Health

## 2016-07-03 VITALS — BP 113/70 | HR 59 | Ht 65.0 in | Wt 169.0 lb

## 2016-07-03 DIAGNOSIS — N943 Premenstrual tension syndrome: Secondary | ICD-10-CM | POA: Diagnosis not present

## 2016-07-03 DIAGNOSIS — F32A Depression, unspecified: Secondary | ICD-10-CM | POA: Insufficient documentation

## 2016-07-03 DIAGNOSIS — F329 Major depressive disorder, single episode, unspecified: Secondary | ICD-10-CM

## 2016-07-03 DIAGNOSIS — F419 Anxiety disorder, unspecified: Secondary | ICD-10-CM | POA: Diagnosis not present

## 2016-07-03 MED ORDER — VENLAFAXINE HCL ER 75 MG PO CP24: 75.0000 mg | ORAL_CAPSULE | Freq: Every day | ORAL | 3 refills | Status: DC

## 2016-07-03 NOTE — Progress Notes (Signed)
Subjective:     Patient ID: Paige Johnson, female   DOB: 09-12-1985, 31 y.o.   MRN: [id number]  HPI Paige Johnson is a 31 year old white female, married in complaining of being snappy and still anxious and maybe depressed, thinks Zoloft needs to be changed, it makes her sleepy or wide awake depending on when she takes it. [place] [place] and wants to feel better.Still has some PMS.  Review of Systems +snappy +PMS +depression and anxiety  Reviewed past medical,surgical, social and family history. Reviewed medications and allergies.     Objective:   Physical Exam BP 113/70 (BP Location: Left Arm, Patient Position: Sitting, Cuff Size: Normal)   Pulse (!) 59   Ht 5\' 5"  (1.651 m)   Wt 169 lb (76.7 kg)   LMP 06/30/2016 (Exact Date)   BMI 28.12 kg/m  Skin warm and dry. Lungs: clear to ausculation bilaterally. Cardiovascular: regular rate and rhythm.   PHQ 9 score 15, denies any suicidal or homicidal ideations.  Will try effexor to see if it helps, she is aware of some of the side effects.  Assessment:     1. Depression, unspecified depression type   2. Anxiety   3. PMS (premenstrual syndrome)       Plan:     Meds ordered this encounter  Medications  . venlafaxine XR (EFFEXOR-XR) 75 MG 24 hr capsule    Sig: Take 1 capsule (75 mg total) by mouth daily.    Dispense:  30 capsule    Refill:  3    Order Specific Question:   Supervising Provider    Answer:   Lazaro ArmsEURE, LUTHER H [[id number]]  Stop Zoloft and start Effexor today Follow up 4/9 when has pap and physical

## 2016-07-03 NOTE — Patient Instructions (Addendum)
Follow up 4/9 as scheduled

## 2016-09-18 ENCOUNTER — Encounter: Payer: Self-pay | Admitting: Adult Health

## 2016-09-18 ENCOUNTER — Ambulatory Visit (INDEPENDENT_AMBULATORY_CARE_PROVIDER_SITE_OTHER): Payer: BLUE CROSS/BLUE SHIELD | Admitting: Adult Health

## 2016-09-18 VITALS — BP 114/60 | HR 72 | Ht 65.25 in | Wt 169.0 lb

## 2016-09-18 DIAGNOSIS — Z01419 Encounter for gynecological examination (general) (routine) without abnormal findings: Secondary | ICD-10-CM

## 2016-09-18 DIAGNOSIS — N943 Premenstrual tension syndrome: Secondary | ICD-10-CM

## 2016-09-18 DIAGNOSIS — R922 Inconclusive mammogram: Secondary | ICD-10-CM

## 2016-09-18 DIAGNOSIS — R9230 Dense breasts, unspecified: Secondary | ICD-10-CM | POA: Insufficient documentation

## 2016-09-18 MED ORDER — FLUOXETINE HCL 20 MG PO CAPS: 20.0000 mg | ORAL_CAPSULE | Freq: Every day | ORAL | 6 refills | Status: DC

## 2016-09-18 NOTE — Progress Notes (Signed)
Patient ID: Paige Johnson, female   DOB: 07/01/1985, 31 y.o.   MRN: [id number] History of Present Illness:  Paige Johnson is a 31 year old white female,married, in for well woman gyn exam, she had normal pap with negative HPV 08/30/15.She is still having PMS even on Effexor and night sweats.Periods are regular.She works at [employer]. Husband had vasectomy.   Current Medications, Allergies, Past Medical History, Past Surgical History, Family History and Social History were reviewed in [facility] record.     Review of Systems: Patient denies any headaches, hearing loss, fatigue, blurred vision, shortness of breath, chest pain, abdominal pain, problems with bowel movements, urination, or intercourse. No joint pain or mood swings.+PMS and has night sweats,periods are regular.    Physical Exam:BP 114/60 (BP Location: Right Arm, Patient Position: Sitting, Cuff Size: Normal)   Pulse 72   Ht 5' 5.25" (1.657 m)   Wt 169 lb (76.7 kg)   LMP 08/24/2016   BMI 27.91 kg/m  General:  Well developed, well nourished, no acute distress Skin:  Warm and dry Neck:  Midline trachea, normal thyroid, good ROM, no lymphadenopathy Lungs; Clear to auscultation bilaterally Breast:  No dominant palpable mass, retraction, or nipple discharge, has dense tissue in right breast at 11-12 o'clock, has had [place] that showed fibroglandular tissue  Cardiovascular: Regular rate and rhythm Abdomen:  Soft, non tender, no hepatosplenomegaly Pelvic:  External genitalia is normal in appearance, no lesions.  The vagina is normal in appearance. Urethra has no lesions or masses. The cervix is bulbous.  Uterus is felt to be normal size, shape, and contour.  No adnexal masses or tenderness noted.Bladder is non tender, no masses felt. Extremities/musculoskeletal:  No swelling or varicosities noted, no clubbing or cyanosis Psych:  No mood changes, alert and cooperative,seems happy PHQ 2 score 1.  Impression: 1.  Well woman exam with routine gynecological exam   2. PMS (premenstrual syndrome)   3. Dense breast tissue       Plan: Physical in 1 year Pap in 2020 Stop effexor and start prozac tomorrow Meds ordered this encounter  Medications  . FLUoxetine (PROZAC) 20 MG capsule    Sig: Take 1 capsule (20 mg total) by mouth daily.    Dispense:  30 capsule    Refill:  6    Order Specific Question:   Supervising Provider    Answer:   Duane Lope H [[id number]]

## 2016-09-18 NOTE — Patient Instructions (Addendum)
Physical in 1 year Pap 2020  

## 2017-01-11 ENCOUNTER — Ambulatory Visit (INDEPENDENT_AMBULATORY_CARE_PROVIDER_SITE_OTHER): Payer: BLUE CROSS/BLUE SHIELD | Admitting: Adult Health

## 2017-01-11 ENCOUNTER — Encounter: Payer: Self-pay | Admitting: Adult Health

## 2017-01-11 VITALS — BP 106/62 | HR 68 | Ht 65.0 in | Wt 167.0 lb

## 2017-01-11 DIAGNOSIS — N6310 Unspecified lump in the right breast, unspecified quadrant: Secondary | ICD-10-CM | POA: Diagnosis not present

## 2017-01-11 DIAGNOSIS — N943 Premenstrual tension syndrome: Secondary | ICD-10-CM

## 2017-01-11 DIAGNOSIS — R922 Inconclusive mammogram: Secondary | ICD-10-CM

## 2017-01-11 MED ORDER — ESCITALOPRAM OXALATE 10 MG PO TABS: 10.0000 mg | ORAL_TABLET | Freq: Every day | ORAL | 6 refills | Status: DC

## 2017-01-11 NOTE — Progress Notes (Signed)
Subjective:     Patient ID: Paige Johnson, female   DOB: [birth year], 31 y.o.   MRN: [id number]  HPI [name] is a 31 year old white female in complaining of PMS, had tried Prozac but stopped last week was very snappy, and has right tender breast mass.Has US on right breast in March 2017 that showed dense breat tissue.   Review of Systems PMS Right tender breast mass Reviewed past medical,surgical, social and family history. Reviewed medications and allergies.     Objective:   Physical Exam BP 106/62 (BP Location: Right Arm, Patient Position: Sitting, Cuff Size: Normal)   Pulse 68   Ht 5\' 5"  (1.651 m)   Wt 167 lb (75.8 kg)   LMP 01/10/2017 (Exact Date)   BMI 27.79 kg/m   Skin warm and dry,  Breasts:no dominate palpable mass, retraction or nipple discharge on left, on right has tender, 2 cm mass at 12 0'clock, 2 Fb from areola, no retraction or nipple discharge. PHQ 9 score 9, discussed trying lexapro and she agrees, denies being suicidal.    Will get diagnotic bilateral mammogram and right US, [facility], it is close to work. Face time 15 minutes with 50% counseling and coordinating care.   Assessment:     1. PMS (premenstrual syndrome)   2. Breast mass, right   3. Dense breast tissue       Plan:     Meds ordered this encounter  Medications  . escitalopram (LEXAPRO) 10 MG tablet    Sig: Take 1 tablet (10 mg total) by mouth daily.    Dispense:  30 tablet    Refill:  6    Order Specific Question:   Supervising Provider    Answer:   Lazaro ArmsEURE, LUTHER H [[id number]]     Diagnostic bilateral mammogram and right US at [facility] 8/9 at 8:10 am  Follow up in 6 weeks

## 2017-01-15 ENCOUNTER — Encounter: Payer: Self-pay | Admitting: *Deleted

## 2017-01-18 ENCOUNTER — Ambulatory Visit: Payer: BLUE CROSS/BLUE SHIELD

## 2017-01-18 ENCOUNTER — Ambulatory Visit
Admission: RE | Admit: 2017-01-18 | Discharge: 2017-01-18 | Disposition: A | Discharge status: Home / Self Care | Payer: BLUE CROSS/BLUE SHIELD | Source: Ambulatory Visit | Attending: Adult Health | Admitting: Adult Health

## 2017-01-18 DIAGNOSIS — N6310 Unspecified lump in the right breast, unspecified quadrant: Secondary | ICD-10-CM

## 2017-01-18 DIAGNOSIS — R922 Inconclusive mammogram: Secondary | ICD-10-CM | POA: Diagnosis not present

## 2017-02-22 ENCOUNTER — Ambulatory Visit: Payer: BLUE CROSS/BLUE SHIELD | Admitting: Adult Health

## 2017-03-01 ENCOUNTER — Ambulatory Visit (INDEPENDENT_AMBULATORY_CARE_PROVIDER_SITE_OTHER): Payer: BLUE CROSS/BLUE SHIELD | Admitting: Adult Health

## 2017-03-01 ENCOUNTER — Encounter: Payer: Self-pay | Admitting: Adult Health

## 2017-03-01 VITALS — BP 90/50 | HR 72 | Ht 65.0 in | Wt 170.0 lb

## 2017-03-01 DIAGNOSIS — N943 Premenstrual tension syndrome: Secondary | ICD-10-CM | POA: Diagnosis not present

## 2017-03-01 DIAGNOSIS — R52 Pain, unspecified: Secondary | ICD-10-CM

## 2017-03-01 NOTE — Progress Notes (Signed)
Subjective:     Patient ID: Paige Johnson, female   DOB: 1986-05-02, 31 y.o.   MRN: [id number]  HPI Paige Johnson is a 31 year old white female, back in follow up on starting lexapro, still has PMS, and has body aches.Has night sweats before periods start.  Review of Systems +PMS still +body aches + night sweats before periods  Reviewed past medical,surgical, social and family history. Reviewed medications and allergies.     Objective:   Physical Exam BP (!) 90/50 (BP Location: Left Arm, Patient Position: Sitting, Cuff Size: Small)   Pulse 72   Ht _0  (1.651 m)   Wt 170 lb (77.1 kg)   LMP 01/10/2017   BMI 28.29 kg/m    PHQ 9 score 14, denies being suicidal, and is on lexapro. Skin warm and dry is tender to palpation at hips and knees, not spine or shoulder blades.  Assessment:     1. PMS (premenstrual syndrome)   2. Body aches       Plan:     Check CBC,CMP,TSH, Free T4 and ANA,ESR and RF Continue lexapro Follow up in 4 weeks

## 2017-03-05 ENCOUNTER — Other Ambulatory Visit: Payer: Self-pay | Admitting: Adult Health

## 2017-03-05 DIAGNOSIS — R52 Pain, unspecified: Secondary | ICD-10-CM | POA: Diagnosis not present

## 2017-03-06 ENCOUNTER — Telehealth: Payer: Self-pay | Admitting: Adult Health

## 2017-03-06 LAB — RHEUMATOID FACTOR: Rhuematoid fact SerPl-aCnc: <10 IU/mL (ref 0.0–13.9)

## 2017-03-06 LAB — COMPREHENSIVE METABOLIC PANEL
ALT: 18 IU/L (ref 0–32)
AST: 22 IU/L (ref 0–40)
Albumin/Globulin Ratio: 2.1 (ref 1.2–2.2)
Albumin: 4.6 g/dL (ref 3.5–5.5)
Alkaline Phosphatase: 48 IU/L (ref 39–117)
BUN/Creatinine Ratio: 15 (ref 9–23)
BUN: 11 mg/dL (ref 6–20)
Bilirubin Total: 0.3 mg/dL (ref 0.0–1.2)
CALCIUM: 9.5 mg/dL (ref 8.7–10.2)
CO2: 25 mmol/L (ref 20–29)
CREATININE: 0.71 mg/dL (ref 0.57–1.00)
Chloride: 102 mmol/L (ref 96–106)
GFR, EST AFRICAN AMERICAN: 132 mL/min/{1.73_m2} (ref >59)
GFR, EST NON AFRICAN AMERICAN: 115 mL/min/{1.73_m2} (ref >59)
GLOBULIN, TOTAL: 2.2 g/dL (ref 1.5–4.5)
Glucose: 94 mg/dL (ref 65–99)
Potassium: 4 mmol/L (ref 3.5–5.2)
SODIUM: 142 mmol/L (ref 134–144)
TOTAL PROTEIN: 6.8 g/dL (ref 6.0–8.5)

## 2017-03-06 LAB — CBC
Hematocrit: 36.8 % (ref 34.0–46.6)
Hemoglobin: 12.4 g/dL (ref 11.1–15.9)
MCH: 31.3 pg (ref 26.6–33.0)
MCHC: 33.7 g/dL (ref 31.5–35.7)
MCV: 93 fL (ref 79–97)
Platelets: 296 10*3/uL (ref 150–379)
RBC: 3.96 x10E6/uL (ref 3.77–5.28)
RDW: 13.9 % (ref 12.3–15.4)
WBC: 5.2 10*3/uL (ref 3.4–10.8)

## 2017-03-06 LAB — T4, FREE: Free T4: 1.19 ng/dL (ref 0.82–1.77)

## 2017-03-06 LAB — SEDIMENTATION RATE: SED RATE: 2 mm/h (ref 0–32)

## 2017-03-06 LAB — ANA: ANA: NEGATIVE

## 2017-03-06 LAB — TSH: TSH: 0.702 u[IU]/mL (ref 0.450–4.500)

## 2017-03-06 NOTE — Telephone Encounter (Signed)
Left message that all labs were normal.

## 2017-03-29 ENCOUNTER — Ambulatory Visit: Payer: BLUE CROSS/BLUE SHIELD | Admitting: Adult Health

## 2017-04-05 ENCOUNTER — Encounter: Payer: Self-pay | Admitting: Adult Health

## 2017-04-05 ENCOUNTER — Ambulatory Visit (INDEPENDENT_AMBULATORY_CARE_PROVIDER_SITE_OTHER): Payer: BLUE CROSS/BLUE SHIELD | Admitting: Adult Health

## 2017-04-05 VITALS — BP 104/60 | HR 68 | Resp 18 | Ht 65.0 in | Wt 168.6 lb

## 2017-04-05 DIAGNOSIS — N943 Premenstrual tension syndrome: Secondary | ICD-10-CM | POA: Diagnosis not present

## 2017-04-05 DIAGNOSIS — F419 Anxiety disorder, unspecified: Secondary | ICD-10-CM | POA: Diagnosis not present

## 2017-04-05 DIAGNOSIS — R52 Pain, unspecified: Secondary | ICD-10-CM

## 2017-04-05 MED ORDER — ESCITALOPRAM OXALATE 20 MG PO TABS: 20.0000 mg | ORAL_TABLET | Freq: Every day | ORAL | 6 refills | Status: DC

## 2017-04-05 NOTE — Progress Notes (Signed)
Subjective:     Patient ID: Paige Johnson, female   DOB: 01-Oct-1985, 31 y.o.   MRN: [id number]  HPI Paige Johnson is a 31 year old white female back in follow up on lexapro and still has PMS but has decreased body aches.  Review of Systems +PMS Decrease body aches Reviewed past medical,surgical, social and family history. Reviewed medications and allergies.     Objective:   Physical Exam BP 104/60 (BP Location: Right Arm, Patient Position: Sitting, Cuff Size: Normal)   Pulse 68   Resp 18   Ht 5\' 5"  (1.651 m)   Wt 168 lb 9.6 oz (76.5 kg)   LMP 03/31/2017 (Exact Date)   SpO2 99%   BMI 28.06 kg/m    Skin warm and dry. Lungs: clear to ausculation bilaterally. Cardiovascular: regular rate and rhythm.PHQ 9 score 10 was 14 in September. Will increase lexapro to 20 mg.  Assessment:     1. Anxiety   2. PMS (premenstrual syndrome)   3. Body aches       Plan:     Meds ordered this encounter  Medications  . escitalopram (LEXAPRO) 20 MG tablet    Sig: Take 1 tablet (20 mg total) by mouth daily.    Dispense:  30 tablet    Refill:  6    Order Specific Question:   Supervising Provider    Answer:   Lazaro ArmsEURE, LUTHER H [[id number]]  Call in F/U will see prn

## 2017-04-10 ENCOUNTER — Encounter: Payer: Self-pay | Admitting: Adult Health

## 2018-03-27 DIAGNOSIS — D2361 Other benign neoplasm of skin of right upper limb, including shoulder: Secondary | ICD-10-CM | POA: Diagnosis not present

## 2018-03-27 DIAGNOSIS — D485 Neoplasm of uncertain behavior of skin: Secondary | ICD-10-CM | POA: Diagnosis not present

## 2018-04-07 ENCOUNTER — Other Ambulatory Visit: Payer: Self-pay | Admitting: Adult Health

## 2018-05-19 ENCOUNTER — Other Ambulatory Visit: Payer: Self-pay

## 2018-05-19 ENCOUNTER — Encounter (HOSPITAL_COMMUNITY): Payer: Self-pay | Admitting: Emergency Medicine

## 2018-05-19 ENCOUNTER — Emergency Department (HOSPITAL_COMMUNITY)
Admission: EM | Admit: 2018-05-19 | Discharge: 2018-05-19 | Disposition: A | Discharge status: Home / Self Care | Payer: BLUE CROSS/BLUE SHIELD | Attending: Emergency Medicine | Admitting: Emergency Medicine

## 2018-05-19 ENCOUNTER — Emergency Department (HOSPITAL_COMMUNITY): Payer: BLUE CROSS/BLUE SHIELD

## 2018-05-19 DIAGNOSIS — Y92410 Unspecified street and highway as the place of occurrence of the external cause: Secondary | ICD-10-CM | POA: Insufficient documentation

## 2018-05-19 DIAGNOSIS — Y9389 Activity, other specified: Secondary | ICD-10-CM | POA: Diagnosis not present

## 2018-05-19 DIAGNOSIS — Y999 Unspecified external cause status: Secondary | ICD-10-CM | POA: Diagnosis not present

## 2018-05-19 DIAGNOSIS — S060X0A Concussion without loss of consciousness, initial encounter: Secondary | ICD-10-CM | POA: Diagnosis not present

## 2018-05-19 DIAGNOSIS — Z79899 Other long term (current) drug therapy: Secondary | ICD-10-CM | POA: Insufficient documentation

## 2018-05-19 DIAGNOSIS — Z87891 Personal history of nicotine dependence: Secondary | ICD-10-CM | POA: Diagnosis not present

## 2018-05-19 DIAGNOSIS — S0990XA Unspecified injury of head, initial encounter: Secondary | ICD-10-CM | POA: Diagnosis not present

## 2018-05-19 MED ORDER — HYDROMORPHONE HCL 1 MG/ML IJ SOLN
1.0000 mg | Freq: Once | INTRAMUSCULAR | Status: AC
  Administered 2018-05-19: 1 mg via INTRAMUSCULAR
  Filled 2018-05-19: qty 1

## 2018-05-19 NOTE — Discharge Instructions (Addendum)
CT scan of head was normal.  These symptoms may last for several more days.  Recommend ibuprofen and Tylenol for headache.  Rest in quiet dark room.
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
## 2018-05-31 ENCOUNTER — Ambulatory Visit (INDEPENDENT_AMBULATORY_CARE_PROVIDER_SITE_OTHER): Payer: BLUE CROSS/BLUE SHIELD | Admitting: Adult Health

## 2018-05-31 ENCOUNTER — Encounter: Payer: Self-pay | Admitting: Adult Health

## 2018-05-31 VITALS — BP 107/66 | HR 60 | Ht 65.25 in | Wt 153.0 lb

## 2018-05-31 DIAGNOSIS — Z01419 Encounter for gynecological examination (general) (routine) without abnormal findings: Secondary | ICD-10-CM | POA: Diagnosis not present

## 2018-05-31 DIAGNOSIS — N943 Premenstrual tension syndrome: Secondary | ICD-10-CM

## 2018-05-31 DIAGNOSIS — F32A Depression, unspecified: Secondary | ICD-10-CM

## 2018-05-31 DIAGNOSIS — F329 Major depressive disorder, single episode, unspecified: Secondary | ICD-10-CM

## 2018-05-31 DIAGNOSIS — N6310 Unspecified lump in the right breast, unspecified quadrant: Secondary | ICD-10-CM

## 2018-05-31 MED ORDER — ESCITALOPRAM OXALATE 20 MG PO TABS: ORAL_TABLET | ORAL | 12 refills | Status: DC

## 2018-05-31 MED ORDER — FLUOXETINE HCL 10 MG PO TABS: ORAL_TABLET | ORAL | 3 refills | Status: DC
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
## 2018-06-24 ENCOUNTER — Ambulatory Visit
Admission: RE | Admit: 2018-06-24 | Discharge: 2018-06-24 | Disposition: A | Discharge status: Home / Self Care | Payer: BLUE CROSS/BLUE SHIELD | Source: Ambulatory Visit | Attending: Adult Health | Admitting: Adult Health

## 2018-06-24 DIAGNOSIS — R922 Inconclusive mammogram: Secondary | ICD-10-CM | POA: Diagnosis not present

## 2018-06-24 DIAGNOSIS — N6310 Unspecified lump in the right breast, unspecified quadrant: Secondary | ICD-10-CM

## 2018-06-24 DIAGNOSIS — N6489 Other specified disorders of breast: Secondary | ICD-10-CM | POA: Diagnosis not present

## 2018-07-26 ENCOUNTER — Ambulatory Visit: Payer: BLUE CROSS/BLUE SHIELD | Admitting: Adult Health

## 2018-08-09 ENCOUNTER — Encounter: Payer: Self-pay | Admitting: Adult Health

## 2018-08-09 ENCOUNTER — Ambulatory Visit: Payer: BLUE CROSS/BLUE SHIELD | Admitting: Adult Health

## 2018-08-09 ENCOUNTER — Encounter (INDEPENDENT_AMBULATORY_CARE_PROVIDER_SITE_OTHER): Payer: Self-pay

## 2018-08-09 VITALS — BP 110/65 | HR 60 | Ht 65.0 in | Wt 157.0 lb

## 2018-08-09 DIAGNOSIS — N943 Premenstrual tension syndrome: Secondary | ICD-10-CM

## 2018-08-09 DIAGNOSIS — F329 Major depressive disorder, single episode, unspecified: Secondary | ICD-10-CM | POA: Diagnosis not present

## 2018-08-09 DIAGNOSIS — F32A Depression, unspecified: Secondary | ICD-10-CM

## 2018-08-09 DIAGNOSIS — F419 Anxiety disorder, unspecified: Secondary | ICD-10-CM | POA: Diagnosis not present

## 2018-08-09 MED ORDER — SERTRALINE HCL 50 MG PO TABS: ORAL_TABLET | ORAL | 2 refills | Status: DC

## 2018-08-09 NOTE — Progress Notes (Signed)
Patient ID: Paige Johnson, female   DOB: 02/23/1986, 33 y.o.   MRN: [id number] History of Present Illness: Paige Johnson is a 33 year old white female, marred in for follow up on starting prozac for PMS, and had night sweats and vivid dreams. Gained about 4 lbs, has lost 26 on low carb diet.   Current Medications, Allergies, Past Medical History, Past Surgical History, Family History and Social History were reviewed in [employer] [facility] record.     Review of Systems: +PMS Had night sweats and vivid dreams with prozac    Physical Exam:BP 110/65 (BP Location: Left Arm, Patient Position: Sitting, Cuff Size: Normal)   Pulse 60   Ht 5\' 5"  (1.651 m)   Wt 157 lb (71.2 kg)   LMP 07/27/2018 (Approximate)   BMI 26.13 kg/m  General:  Well developed, well nourished, no acute distress Skin:  Warm and dry Psych:  No mood changes, alert and cooperative,seems happy Fall risk is lsow.PHQ 9 score 10, denies being suicidal, is on lexapro and tried prozac for PMS.will continue lexapro and stop prozac and try zoloft for PMS.  Impression: 1. Depression, unspecified depression type   2. PMS (premenstrual syndrome)   3. Anxiety       Plan: Continue lexapro Meds ordered this encounter  Medications  . sertraline (ZOLOFT) 50 MG tablet    Sig: Take 1 for daily    Dispense:  30 tablet    Refill:  2    Order Specific Question:   Supervising Provider    Answer:   Despina Hidden, LUTHER H [[id number]]  F/U in 8 weeks  Try mixing diet up

## 2018-10-11 ENCOUNTER — Ambulatory Visit: Payer: Self-pay | Admitting: Adult Health

## 2018-10-24 ENCOUNTER — Ambulatory Visit (INDEPENDENT_AMBULATORY_CARE_PROVIDER_SITE_OTHER)
Admission: EM | Admit: 2018-10-24 | Discharge: 2018-10-24 | Disposition: A | Discharge status: Home / Self Care | Payer: BLUE CROSS/BLUE SHIELD | Source: Home / Self Care

## 2018-10-24 ENCOUNTER — Emergency Department (HOSPITAL_COMMUNITY): Payer: BLUE CROSS/BLUE SHIELD

## 2018-10-24 ENCOUNTER — Encounter (HOSPITAL_COMMUNITY): Payer: Self-pay | Admitting: Emergency Medicine

## 2018-10-24 ENCOUNTER — Other Ambulatory Visit: Payer: Self-pay

## 2018-10-24 ENCOUNTER — Emergency Department (HOSPITAL_COMMUNITY)
Admission: EM | Admit: 2018-10-24 | Discharge: 2018-10-24 | Disposition: A | Discharge status: Home / Self Care | Payer: BLUE CROSS/BLUE SHIELD | Attending: Emergency Medicine | Admitting: Emergency Medicine

## 2018-10-24 DIAGNOSIS — N8301 Follicular cyst of right ovary: Secondary | ICD-10-CM | POA: Diagnosis not present

## 2018-10-24 DIAGNOSIS — R112 Nausea with vomiting, unspecified: Secondary | ICD-10-CM | POA: Diagnosis not present

## 2018-10-24 DIAGNOSIS — Z87891 Personal history of nicotine dependence: Secondary | ICD-10-CM | POA: Diagnosis not present

## 2018-10-24 DIAGNOSIS — R197 Diarrhea, unspecified: Secondary | ICD-10-CM

## 2018-10-24 DIAGNOSIS — K529 Noninfective gastroenteritis and colitis, unspecified: Secondary | ICD-10-CM

## 2018-10-24 DIAGNOSIS — R109 Unspecified abdominal pain: Secondary | ICD-10-CM | POA: Diagnosis not present

## 2018-10-24 DIAGNOSIS — R1031 Right lower quadrant pain: Secondary | ICD-10-CM | POA: Diagnosis not present

## 2018-10-24 DIAGNOSIS — R509 Fever, unspecified: Secondary | ICD-10-CM | POA: Insufficient documentation

## 2018-10-24 DIAGNOSIS — R102 Pelvic and perineal pain: Secondary | ICD-10-CM | POA: Diagnosis not present

## 2018-10-24 DIAGNOSIS — N83291 Other ovarian cyst, right side: Secondary | ICD-10-CM | POA: Diagnosis not present

## 2018-10-24 DIAGNOSIS — R1110 Vomiting, unspecified: Secondary | ICD-10-CM | POA: Diagnosis not present

## 2018-10-24 LAB — COMPREHENSIVE METABOLIC PANEL
ALT: 47 U/L — ABNORMAL HIGH (ref 0–44)
AST: 41 U/L (ref 15–41)
Albumin: 4.3 g/dL (ref 3.5–5.0)
Alkaline Phosphatase: 59 U/L (ref 38–126)
Anion gap: 7 (ref 5–15)
BUN: 7 mg/dL (ref 6–20)
CO2: 27 mmol/L (ref 22–32)
Calcium: 8.9 mg/dL (ref 8.9–10.3)
Chloride: 103 mmol/L (ref 98–111)
Creatinine, Ser: 0.65 mg/dL (ref 0.44–1.00)
GFR calc Af Amer: >60 mL/min (ref >60)
GFR calc non Af Amer: >60 mL/min (ref >60)
Glucose, Bld: 155 mg/dL — ABNORMAL HIGH (ref 70–99)
Potassium: 3.1 mmol/L — ABNORMAL LOW (ref 3.5–5.1)
Sodium: 137 mmol/L (ref 135–145)
Total Bilirubin: 0.7 mg/dL (ref 0.3–1.2)
Total Protein: 7.7 g/dL (ref 6.5–8.1)

## 2018-10-24 LAB — CBC WITH DIFFERENTIAL/PLATELET
Abs Immature Granulocytes: 0.01 10*3/uL (ref 0.00–0.07)
Basophils Absolute: 0 10*3/uL (ref 0.0–0.1)
Basophils Relative: 0 %
Eosinophils Absolute: 0 10*3/uL (ref 0.0–0.5)
Eosinophils Relative: 0 %
HCT: 44.8 % (ref 36.0–46.0)
Hemoglobin: 14.5 g/dL (ref 12.0–15.0)
Immature Granulocytes: 0 %
Lymphocytes Relative: 7 %
Lymphs Abs: 0.6 10*3/uL — ABNORMAL LOW (ref 0.7–4.0)
MCH: 31.9 pg (ref 26.0–34.0)
MCHC: 32.4 g/dL (ref 30.0–36.0)
MCV: 98.5 fL (ref 80.0–100.0)
Monocytes Absolute: 0.5 10*3/uL (ref 0.1–1.0)
Monocytes Relative: 6 %
Neutro Abs: 6.8 10*3/uL (ref 1.7–7.7)
Neutrophils Relative %: 87 %
Platelets: 232 10*3/uL (ref 150–400)
RBC: 4.55 MIL/uL (ref 3.87–5.11)
RDW: 13.2 % (ref 11.5–15.5)
WBC: 7.8 10*3/uL (ref 4.0–10.5)
nRBC: 0 % (ref 0.0–0.2)

## 2018-10-24 LAB — URINALYSIS, ROUTINE W REFLEX MICROSCOPIC
Bilirubin Urine: NEGATIVE
Glucose, UA: NEGATIVE mg/dL
Ketones, ur: 80 mg/dL — AB
Leukocytes,Ua: NEGATIVE
Nitrite: NEGATIVE
Protein, ur: 100 mg/dL — AB
Specific Gravity, Urine: 1.029 (ref 1.005–1.030)
pH: 5 (ref 5.0–8.0)

## 2018-10-24 LAB — PREGNANCY, URINE: Preg Test, Ur: NEGATIVE

## 2018-10-24 LAB — LIPASE, BLOOD: Lipase: 29 U/L (ref 11–51)

## 2018-10-24 MED ORDER — CIPROFLOXACIN IN D5W 400 MG/200ML IV SOLN
400.0000 mg | Freq: Once | INTRAVENOUS | Status: AC
  Administered 2018-10-24: 17:00:00 400 mg via INTRAVENOUS
  Filled 2018-10-24: qty 200

## 2018-10-24 MED ORDER — POTASSIUM CHLORIDE CRYS ER 20 MEQ PO TBCR
40.0000 meq | EXTENDED_RELEASE_TABLET | Freq: Once | ORAL | Status: AC
  Administered 2018-10-24: 40 meq via ORAL
  Filled 2018-10-24: qty 2

## 2018-10-24 MED ORDER — METRONIDAZOLE 500 MG PO TABS: 500.0000 mg | ORAL_TABLET | Freq: Three times a day (TID) | ORAL | 0 refills | Status: DC

## 2018-10-24 MED ORDER — CIPROFLOXACIN HCL 500 MG PO TABS: 500.0000 mg | ORAL_TABLET | Freq: Two times a day (BID) | ORAL | 0 refills | Status: DC

## 2018-10-24 MED ORDER — TRAMADOL HCL 50 MG PO TABS: 50.0000 mg | ORAL_TABLET | Freq: Three times a day (TID) | ORAL | 0 refills | Status: DC | PRN

## 2018-10-24 MED ORDER — METRONIDAZOLE 500 MG PO TABS
500.0000 mg | ORAL_TABLET | Freq: Once | ORAL | Status: AC
  Administered 2018-10-24: 500 mg via ORAL
  Filled 2018-10-24: qty 1

## 2018-10-24 MED ORDER — IOHEXOL 300 MG/ML  SOLN
100.0000 mL | Freq: Once | INTRAMUSCULAR | Status: AC | PRN
  Administered 2018-10-24: 15:00:00 100 mL via INTRAVENOUS

## 2018-10-24 NOTE — Discharge Instructions (Signed)
Recommending further evaluation and management at [facility] for RLQ pain and associated N/V/D and fever with tmax of 103.9 at home. Cannot rule out appendicitis in urgent care setting. Patient aware and in agreement with plan.

## 2018-10-24 NOTE — ED Notes (Signed)
Patient transported to ultrasound.

## 2018-10-24 NOTE — ED Triage Notes (Addendum)
Pt c/o RLQ abdominal pain with n/v/d and fever that began yesterday. Pt sent over by UC to r/o appendicitis. Denies SOB or cough. States she had a fever of 103 when she woke up this morning. Pt took Motrin at 11am.

## 2018-10-24 NOTE — ED Triage Notes (Signed)
Pt states that she began having rlq pain yesterday with fever, vomiting and diarrhea

## 2018-10-24 NOTE — ED Notes (Signed)
Patient tolerating fluids with no complaints

## 2018-10-24 NOTE — ED Provider Notes (Signed)
[facility] EMERGENCY DEPARTMENT Provider Note   CSN: [id number] Arrival date & time: 10/24/18  1238    History   Chief Complaint Chief Complaint  Patient presents with   Abdominal Pain    HPI Aizah Hefner Garofano is a 33 y.o. female.     HPI  Pt was seen at 1300.  Per pt, c/o gradual onset and persistence of constant right sided abd "pain" since yesterday.  Has been associated with multiple intermittent episodes of N/V/D.  Describes the abd pain as "aching" and "dull." Describes the diarrhea as "watery."  Has been associated with home fevers to "103.9." Pt was evaluated by [facility] P[facility], then sent to the ED for further evaluation "to r/o appendicitis." Denies cough, known COVID+ exposures. Denies back pain, no rash, no CP/SOB, no black or blood in stools or emesis, no dysuria/hematuria, no vaginal bleeding/discharge.     Past Medical History:  Diagnosis Date   Anxiety 08/30/2015   Bilateral leg pain 08/30/2015   Achy, worse at time of period, stands 14 hours a day   Pelvic pain in female 08/30/2015   PMS (premenstrual syndrome) 08/30/2015    Patient Active Problem List   Diagnosis Date Noted   Dense breast tissue 09/18/2016   Well woman exam with routine gynecological exam 09/18/2016   Depression 07/03/2016   Anxiety 08/30/2015   PMS (premenstrual syndrome) 08/30/2015   Pelvic pain in female 08/30/2015   Bilateral leg pain 08/30/2015   Breast mass, right 08/19/2015   ADD (attention deficit disorder) 10/05/2013   Back muscle spasm 03/10/2013    Past Surgical History:  Procedure Laterality Date   CESAREAN SECTION  2010   ECTOPIC PREGNANCY SURGERY  2006     OB History    Gravida  5   Para  4   Term  2   Preterm      AB  1   Living  2     SAB      TAB      Ectopic  1   Multiple      Live Births  2            Home Medications    Prior to Admission medications   Medication Sig Start Date End Date Taking? Authorizing Provider    escitalopram (LEXAPRO) 20 MG tablet TAKE 1 TABLET(20 MG) BY MOUTH DAILY 05/31/18   Adline Potter, NP  sertraline (ZOLOFT) 50 MG tablet Take 1 for daily 08/09/18   Adline Potter, NP    Family History Family History  Problem Relation Age of Onset   Other Mother        brain tumor; beign   Diabetes Father    Hypertension Father    Diabetes Paternal Grandmother    Hypertension Paternal Grandmother    Heart disease Paternal Grandmother    Hypertension Paternal Grandfather    Heart disease Paternal Grandfather    Heart attack Paternal Grandfather    Cancer Maternal Aunt        lung   Alcohol abuse Maternal Grandfather    Thyroid disease Paternal Uncle     Social History Social History   Tobacco Use   Smoking status: Former Smoker    Types: Cigarettes   Smokeless tobacco: Never Used  Substance Use Topics   Alcohol use: No   Drug use: No     Allergies   Patient has no known allergies.   Review of Systems Review of Systems ROS: Statement: All systems  negative except as marked or noted in the HPI; Constitutional: +fever and chills. ; ; Eyes: Negative for eye pain, redness and discharge. ; ; ENMT: Negative for ear pain, hoarseness, nasal congestion, sinus pressure and sore throat. ; ; Cardiovascular: Negative for chest pain, palpitations, diaphoresis, dyspnea and peripheral edema. ; ; Respiratory: Negative for cough, wheezing and stridor. ; ; Gastrointestinal: +N/V/D, abd pain. Negative for blood in stool, hematemesis, jaundice and rectal bleeding. . ; ; Genitourinary: Negative for dysuria, flank pain and hematuria. ; ; Musculoskeletal: Negative for back pain and neck pain. Negative for swelling and trauma.; ; GYN:  No pelvic pain, no vaginal bleeding, no vaginal discharge, no vulvar pain. ;; Skin: Negative for pruritus, rash, abrasions, blisters, bruising and skin lesion.; ; Neuro: Negative for headache, lightheadedness and neck stiffness. Negative for  weakness, altered level of consciousness, altered mental status, extremity weakness, paresthesias, involuntary movement, seizure and syncope.       Physical Exam Updated Vital Signs BP 122/70    Pulse 85    Temp 99.1 F (37.3 C) (Oral)    Resp 14    Ht 5\' 5"  (1.651 m)    Wt 68 kg    LMP 10/10/2018 (Approximate)    SpO2 99%    BMI 24.96 kg/m   BP 139/77    Pulse 92    Temp 98.9 F (37.2 C) (Oral)    Resp 16    Ht 5\' 5"  (1.651 m)    Wt 68 kg    LMP 10/10/2018 (Approximate) Comment: neg preg   SpO2 98%    BMI 24.96 kg/m    Physical Exam 1305: Physical examination:  Nursing notes reviewed; Vital signs and O2 SAT reviewed;  Constitutional: Well developed, Well nourished, Well hydrated, In no acute distress; Head:  Normocephalic, atraumatic; Eyes: EOMI, PERRL, No scleral icterus; ENMT: Mouth and pharynx normal, Mucous membranes moist; Neck: Supple, Full range of motion, No lymphadenopathy; Cardiovascular: Regular rate and rhythm, No gallop; Respiratory: Breath sounds clear & equal bilaterally, No wheezes.  Speaking full sentences with ease, Normal respiratory effort/excursion; Chest: Nontender, Movement normal; Abdomen: Soft, +RLQ tenderness to palp. No rebound or guarding. Nondistended, Normal bowel sounds; Genitourinary: No CVA tenderness; Extremities: Peripheral pulses normal, No tenderness, No edema, No calf edema or asymmetry.; Neuro: AA&Ox3, Major CN grossly intact.  Speech clear. No gross focal motor or sensory deficits in extremities.; Skin: Color normal, Warm, Dry.    ED Treatments / Results  Labs (all labs ordered are listed, but only abnormal results are displayed)   EKG None  Radiology   Procedures Procedures (including critical care time)  Medications Ordered in ED Medications - No data to display   Initial Impression / Assessment and Plan / ED Course  I have reviewed the triage vital signs and the nursing notes.  Pertinent labs & imaging results that were available  during my care of the patient were reviewed by me and considered in my medical decision making (see chart for details).     MDM Reviewed: previous chart, nursing note and vitals Reviewed previous: labs Interpretation: labs, CT scan and ultrasound    Results for orders placed or performed during the hospital encounter of 10/24/18  Pregnancy, urine  Result Value Ref Range   Preg Test, Ur NEGATIVE NEGATIVE  Urinalysis, Routine w reflex microscopic  Result Value Ref Range   Color, Urine Lylia (A) YELLOW   APPearance CLOUDY (A) CLEAR   Specific Gravity, Urine 1.029 1.005 - 1.030  pH 5.0 5.0 - 8.0   Glucose, UA NEGATIVE NEGATIVE mg/dL   Hgb urine dipstick SMALL (A) NEGATIVE   Bilirubin Urine NEGATIVE NEGATIVE   Ketones, ur 80 (A) NEGATIVE mg/dL   Protein, ur 161 (A) NEGATIVE mg/dL   Nitrite NEGATIVE NEGATIVE   Leukocytes,Ua NEGATIVE NEGATIVE   RBC / HPF 11-20 0 - 5 RBC/hpf   WBC, UA 11-20 0 - 5 WBC/hpf   Bacteria, UA RARE (A) NONE SEEN   Squamous Epithelial / LPF 21-50 0 - 5   Mucus PRESENT   Comprehensive metabolic panel  Result Value Ref Range   Sodium 137 135 - 145 mmol/L   Potassium 3.1 (L) 3.5 - 5.1 mmol/L   Chloride 103 98 - 111 mmol/L   CO2 27 22 - 32 mmol/L   Glucose, Bld 155 (H) 70 - 99 mg/dL   BUN 7 6 - 20 mg/dL   Creatinine, Ser 0.96 0.44 - 1.00 mg/dL   Calcium 8.9 8.9 - 04.5 mg/dL   Total Protein 7.7 6.5 - 8.1 g/dL   Albumin 4.3 3.5 - 5.0 g/dL   AST 41 15 - 41 U/L   ALT 47 (H) 0 - 44 U/L   Alkaline Phosphatase 59 38 - 126 U/L   Total Bilirubin 0.7 0.3 - 1.2 mg/dL   GFR calc non Af Amer >60 >60 mL/min   GFR calc Af Amer >60 >60 mL/min   Anion gap 7 5 - 15  Lipase, blood  Result Value Ref Range   Lipase 29 11 - 51 U/L  CBC with Differential  Result Value Ref Range   WBC 7.8 4.0 - 10.5 K/uL   RBC 4.55 3.87 - 5.11 MIL/uL   Hemoglobin 14.5 12.0 - 15.0 g/dL   HCT 40.9 81.1 - 91.4 %   MCV 98.5 80.0 - 100.0 fL   MCH 31.9 26.0 - 34.0 pg   MCHC 32.4 30.0 -  36.0 g/dL   RDW 78.2 95.6 - 21.3 %   Platelets 232 150 - 400 K/uL   nRBC 0.0 0.0 - 0.2 %   Neutrophils Relative % 87 %   Neutro Abs 6.8 1.7 - 7.7 K/uL   Lymphocytes Relative 7 %   Lymphs Abs 0.6 (L) 0.7 - 4.0 K/uL   Monocytes Relative 6 %   Monocytes Absolute 0.5 0.1 - 1.0 K/uL   Eosinophils Relative 0 %   Eosinophils Absolute 0.0 0.0 - 0.5 K/uL   Basophils Relative 0 %   Basophils Absolute 0.0 0.0 - 0.1 K/uL   Immature Granulocytes 0 %   Abs Immature Granulocytes 0.01 0.00 - 0.07 K/uL    US Pelvis Complete Result Date: 10/24/2018 CLINICAL DATA:  33 year old female with right lower quadrant pelvic pain and fever. Prior history of right tubal ectopic pregnancy. EXAM: TRANSABDOMINAL AND TRANSVAGINAL ULTRASOUND OF PELVIS DOPPLER ULTRASOUND OF OVARIES TECHNIQUE: Both transabdominal and transvaginal ultrasound examinations of the pelvis were performed. Transabdominal technique was performed for global imaging of the pelvis including uterus, ovaries, adnexal regions, and pelvic cul-de-sac. It was necessary to proceed with endovaginal exam following the transabdominal exam to visualize the right ovary. Color and duplex Doppler ultrasound was utilized to evaluate blood flow to the ovaries. COMPARISON:  None. FINDINGS: Uterus Measurements: 8.1 x 4.3 x 5.6 cm = volume: 103 mL. No fibroids or other mass visualized. Endometrium Thickness: 9 mm.  No focal abnormality visualized. Right ovary Measurements: 4.2 x 3.9 x 4.2 cm = volume: 23 mL. Normal appearance/no adnexal mass. Incidental note  is made of a simple follicular cyst measuring up to 2.5 cm in diameter. This is a normal finding in a reproductive age female. Left ovary Measurements: 3.6 x 2.9 x 2.8 cm = volume: 15 mL. Normal appearance/no adnexal mass. Pulsed Doppler evaluation of both ovaries demonstrates normal low-resistance arterial and venous waveforms. Other findings Small volume free fluid in the pelvis is likely physiologic. IMPRESSION: 1. No  evidence of ovarian torsion or other acute abnormality at this time. 2. Incidental note is made of a 2.5 cm simple follicular cyst in the right ovary. This is considered a normal finding in a reproductive age female. Electronically Signed   By: Malachy Moan M.D.   On: 10/24/2018 14:27    Ct Abdomen Pelvis W Contrast Result Date: 10/24/2018 CLINICAL DATA:  Right lower quadrant pain, fever, nausea and vomiting EXAM: CT ABDOMEN AND PELVIS WITH CONTRAST TECHNIQUE: Multidetector CT imaging of the abdomen and pelvis was performed using the standard protocol following bolus administration of intravenous contrast. CONTRAST:  OMNIPAQUE IOHEXOL 300 MG/ML  SOLN COMPARISON:  10/24/2018 pelvic ultrasound FINDINGS: Lower chest: No acute abnormality. Hepatobiliary: No focal liver abnormality is seen. No gallstones, gallbladder wall thickening, or biliary dilatation. Pancreas: Unremarkable. No pancreatic ductal dilatation or surrounding inflammatory changes. Spleen: Normal in size without focal abnormality. Adrenals/Urinary Tract: Adrenal glands are unremarkable. Kidneys are normal, without renal calculi, focal lesion, or hydronephrosis. Bladder is unremarkable. Stomach/Bowel: Negative for bowel obstruction, significant dilatation, ileus, free air. Diffuse bowel wall thickening/edema of the colon extending from the cecum to the distal transverse colon compatible with acute colitis. No associated obstruction pattern, abscess, or pneumatosis. Right lower quadrant mesenteric prominent lymph nodes, suspect reactive. Portions of the appendix are demonstrated and appear normal. Vascular/Lymphatic: Intact aorta. Negative for aneurysm or occlusive process. Mesenteric and renal vasculature remain patent. No bulky abnormal adenopathy Reproductive: Uterus normal in size and anteverted. Right ovary contains a small cyst measuring 2.5 cm. Left ovary and adnexa unremarkable. Trace pelvic free fluid. Other: No inguinal or abdominal  wall hernia. Navel piercing noted creating artifact. Musculoskeletal: No acute osseous finding. IMPRESSION: Acute colitis extending from the cecum to the distal transverse colon without other complicating feature. Normal appendix 2.5 cm right ovarian cyst Trace pelvic free fluid Electronically Signed   By: Judie Petit.  Shick M.D.   On: 10/24/2018 15:41    Calyn Hefner Bloomquist was evaluated in Emergency Department on 10/24/2018 for the symptoms described in the history of present illness. She was evaluated in the context of the global COVID-19 pandemic, which necessitated consideration that the patient might be at risk for infection with the SARS-CoV-2 virus that causes COVID-19. Institutional protocols and algorithms that pertain to the evaluation of patients at risk for COVID-19 are in a state of rapid change based on information released by regulatory bodies including the CDC and federal and state organizations. These policies and algorithms were followed during the patient's care in the ED.    1555:  WBC count normal. No fever while in the ED. Will replete potassium PO. Will also dose PO cipro/flagyl for CT findings above. Sign out to Dr. Juleen China.      Final Clinical Impressions(s) / ED Diagnoses   Final diagnoses:  Pelvic pain  Pelvic pain    ED Discharge Orders    None       Samuel Jester, DO 10/28/18 [id number]

## 2018-10-24 NOTE — ED Notes (Signed)
Pt finishing IV antibiotics. Will d/c upon completion.

## 2018-10-24 NOTE — ED Provider Notes (Signed)
[facility]   [id number] 10/24/18 Arrival Time: 1143  CC: ABDOMINAL pain, NVD and fever  SUBJECTIVE:  Paige Johnson is a 33 y.o. female hx of anxiety, who presents with complaint of abdominal pain, fever, with tmax of 103.9, nausea, vomiting x 4-5 episodes, and watery diarrhea x 5-6 episodes that began 1 day ago.   Denies eating anything out of the ordinary, close contacts with similar symptoms, recent travel or antibiotic use.  Denies sick exposure to COVID, flu or strep.  Does work for a [employer], trade [employer].  Localizes pain to RLQ.  6/10.  Has tried OTC ibuprofen without relief.  Denies alleviating or aggravating factors.  Denies similar symptoms in the past. Complains of associated fatigue, and decreased appetite.  Denies chest pain, SOB, constipation, hematochezia, melena, dysuria, difficulty urinating, increased frequency or urgency, flank pain, loss of bowel or bladder function, vaginal discharge, vaginal odor, vaginal bleeding, dyspareunia, pelvic pain.     Patient's last menstrual period was 10/10/2018 (approximate). Husband post vasectomy, not concerned for pregnancy.  ROS: As per HPI.  Past Medical History:  Diagnosis Date  . Anxiety 08/30/2015  . Bilateral leg pain 08/30/2015   Achy, worse at time of period, stands 14 hours a day  . Pelvic pain in female 08/30/2015  . PMS (premenstrual syndrome) 08/30/2015   Past Surgical History:  Procedure Laterality Date  . CESAREAN SECTION  2010  . ECTOPIC PREGNANCY SURGERY  2006   No Known Allergies No current facility-administered medications on file prior to encounter.    Current Outpatient Medications on File Prior to Encounter  Medication Sig Dispense Refill  . escitalopram (LEXAPRO) 20 MG tablet TAKE 1 TABLET(20 MG) BY MOUTH DAILY 30 tablet 12  . sertraline (ZOLOFT) 50 MG tablet Take 1 for daily 30 tablet 2   Social History   Socioeconomic History  . Marital status: Married    Spouse name: Not on file  . Number  of children: Not on file  . Years of education: Not on file  . Highest education level: Not on file  Occupational History  . Not on file  Social Needs  . Financial resource strain: Not on file  . Food insecurity:    Worry: Not on file    Inability: Not on file  . Transportation needs:    Medical: Not on file    Non-medical: Not on file  Tobacco Use  . Smoking status: Former Smoker    Types: Cigarettes  . Smokeless tobacco: Never Used  Substance and Sexual Activity  . Alcohol use: No  . Drug use: No  . Sexual activity: Yes    Birth control/protection: Other-see comments    Comment: husband had vasectomy   Lifestyle  . Physical activity:    Days per week: Not on file    Minutes per session: Not on file  . Stress: Not on file  Relationships  . Social connections:    Talks on phone: Not on file    Gets together: Not on file    Attends religious service: Not on file    Active member of club or organization: Not on file    Attends meetings of clubs or organizations: Not on file    Relationship status: Not on file  . Intimate partner violence:    Fear of current or ex partner: Not on file    Emotionally abused: Not on file    Physically abused: Not on file    Forced sexual activity: Not on  file  Other Topics Concern  . Not on file  Social History Narrative  . Not on file   Family History  Problem Relation Age of Onset  . Other Mother        brain tumor; beign  . Diabetes Father   . Hypertension Father   . Diabetes Paternal Grandmother   . Hypertension Paternal Grandmother   . Heart disease Paternal Grandmother   . Hypertension Paternal Grandfather   . Heart disease Paternal Grandfather   . Heart attack Paternal Grandfather   . Cancer Maternal Aunt        lung  . Alcohol abuse Maternal Grandfather   . Thyroid disease Paternal Uncle      OBJECTIVE:  Vitals:   10/24/18 1155  BP: 123/78  Pulse: 77  Resp: 18  Temp: 99.1 F (37.3 C)  TempSrc: Oral  SpO2:  96%    General appearance: Alert; appears uncomfortable, but nontoxic HEENT: NCAT.   Lungs: clear to auscultation bilaterally without adventitious breath sounds Heart: regular rate and rhythm.   Abdomen: soft, non-distended; normal active bowel sounds; TTP at McBurney's point; negative Murphy's sign; negative rebound; + minimal guarding Back: no CVA tenderness Extremities: no edema; symmetrical with no gross deformities Skin: warm and dry Neurologic: normal gait Psychological: alert and cooperative; normal mood and affect  ASSESSMENT & PLAN:  1. Right lower quadrant abdominal pain   2. Nausea vomiting and diarrhea   3. Fever, unspecified    Declines urine dip and pregnancy.  Will defer to ED.   Recommending further evaluation and management at [facility] for RLQ pain and associated N/V/D and fever with tmax of 103.9 at home. Cannot rule out appendicitis in urgent care setting. Patient aware and in agreement with plan.     Rennis HardingWurst, Saia Derossett, PA-C 10/24/18 1232

## 2018-10-24 NOTE — ED Notes (Signed)
Patient transported to CT 

## 2018-10-25 ENCOUNTER — Other Ambulatory Visit: Payer: BLUE CROSS/BLUE SHIELD

## 2018-10-25 ENCOUNTER — Other Ambulatory Visit: Payer: Self-pay | Admitting: Internal Medicine

## 2018-10-25 ENCOUNTER — Telehealth: Payer: Self-pay | Admitting: Hematology

## 2018-10-25 DIAGNOSIS — Z20822 Contact with and (suspected) exposure to covid-19: Secondary | ICD-10-CM

## 2018-10-25 DIAGNOSIS — R5381 Other malaise: Secondary | ICD-10-CM | POA: Diagnosis not present

## 2018-10-25 DIAGNOSIS — R6889 Other general symptoms and signs: Secondary | ICD-10-CM | POA: Diagnosis not present

## 2018-10-25 DIAGNOSIS — W19XXXA Unspecified fall, initial encounter: Secondary | ICD-10-CM | POA: Diagnosis not present

## 2018-10-25 DIAGNOSIS — R69 Illness, unspecified: Secondary | ICD-10-CM | POA: Diagnosis not present

## 2018-10-25 NOTE — Telephone Encounter (Signed)
Dr. Karlyn Agee (her employer) is wanting her to be tested. Patient states she has had fever of 103., chills, headache, etc. For negative results please call the patient

## 2018-10-30 ENCOUNTER — Telehealth: Payer: Self-pay

## 2018-10-30 NOTE — Telephone Encounter (Signed)
Dr. Karlyn Agee, patient employer, called to ask if the patient's COVID test results from 10/25/18 are back yet, I advised it is still not resulted and it can take up to 7 days for the results due to the amount of testing being done, he verbalized understanding.

## 2018-10-31 NOTE — Telephone Encounter (Signed)
Patient called to check on the status of her COIVD testing. Read to her the same message that Misty Stanley stated to the employer. Patient verbalized understanding.

## 2018-11-05 LAB — NOVEL CORONAVIRUS, NAA: SARS-CoV-2, NAA: NOT DETECTED

## 2018-11-07 ENCOUNTER — Telehealth: Payer: Self-pay | Admitting: Hematology

## 2018-11-07 NOTE — Telephone Encounter (Signed)
-----   Message from Isa Rankin, MD sent at 11/07/2018  1:49 PM EDT ----- Regarding: late result I do not think this one came into the results pool?  Test was negative.

## 2018-11-07 NOTE — Telephone Encounter (Signed)
Left VM advising patient that her COVID screening is negative. /

## 2018-11-18 ENCOUNTER — Other Ambulatory Visit: Payer: Self-pay | Admitting: Adult Health

## 2019-04-18 ENCOUNTER — Other Ambulatory Visit: Payer: Self-pay | Admitting: Adult Health

## 2019-06-02 ENCOUNTER — Other Ambulatory Visit: Payer: Self-pay | Admitting: Adult Health

## 2019-06-03 ENCOUNTER — Other Ambulatory Visit: Payer: Self-pay | Admitting: Adult Health

## 2019-06-09 ENCOUNTER — Other Ambulatory Visit: Payer: Self-pay | Admitting: *Deleted

## 2019-06-10 MED ORDER — ESCITALOPRAM OXALATE 20 MG PO TABS: ORAL_TABLET | ORAL | 12 refills | Status: DC

## 2019-08-07 ENCOUNTER — Telehealth: Payer: Self-pay | Admitting: Adult Health

## 2019-08-07 NOTE — Telephone Encounter (Signed)

## 2019-08-11 ENCOUNTER — Encounter: Payer: Self-pay | Admitting: Adult Health

## 2019-08-11 ENCOUNTER — Other Ambulatory Visit: Payer: Self-pay

## 2019-08-11 ENCOUNTER — Ambulatory Visit (INDEPENDENT_AMBULATORY_CARE_PROVIDER_SITE_OTHER): Payer: No Typology Code available for payment source | Admitting: Adult Health

## 2019-08-11 ENCOUNTER — Other Ambulatory Visit (HOSPITAL_COMMUNITY)
Admission: RE | Admit: 2019-08-11 | Discharge: 2019-08-11 | Disposition: A | Discharge status: Home / Self Care | Payer: No Typology Code available for payment source | Source: Ambulatory Visit | Attending: Adult Health | Admitting: Adult Health

## 2019-08-11 VITALS — BP 110/70 | HR 63 | Ht 65.0 in | Wt 165.0 lb

## 2019-08-11 DIAGNOSIS — F419 Anxiety disorder, unspecified: Secondary | ICD-10-CM

## 2019-08-11 DIAGNOSIS — Z01419 Encounter for gynecological examination (general) (routine) without abnormal findings: Secondary | ICD-10-CM | POA: Insufficient documentation

## 2019-08-11 DIAGNOSIS — R6882 Decreased libido: Secondary | ICD-10-CM | POA: Insufficient documentation

## 2019-08-11 DIAGNOSIS — N943 Premenstrual tension syndrome: Secondary | ICD-10-CM

## 2019-08-11 MED ORDER — ESCITALOPRAM OXALATE 20 MG PO TABS: ORAL_TABLET | ORAL | 4 refills | Status: DC

## 2019-08-11 MED ORDER — SERTRALINE HCL 50 MG PO TABS: ORAL_TABLET | ORAL | 3 refills | Status: DC

## 2019-08-11 NOTE — Progress Notes (Signed)
Patient ID: Paige Johnson, female   DOB: [birth year], 34 y.o.   MRN: [id number] History of Present Illness: Paige Johnson is a 34 year old white female, married, [id number] in for a well woman gyn exam and pap. She is working from home for [employer], now and loves it.    Current Medications, Allergies, Past Medical History, Past Surgical History, Family History and Social History were reviewed in [employer] record.     Review of Systems: Patient denies any headaches, hearing loss, fatigue, blurred vision, shortness of breath, chest pain, abdominal pain, problems with bowel movements, urination, or intercourse. No joint pain or mood swings. +decreased libido and PMS  +night sweats     Physical Exam:BP 110/70 (BP Location: Left Arm, Patient Position: Sitting, Cuff Size: Normal)   Pulse 63   Ht 5\' 5"  (1.651 m)   Wt 165 lb (74.8 kg)   LMP 08/04/2019 (Approximate)   BMI 27.46 kg/m  General:  Well developed, well nourished, no acute distress Skin:  Warm and dry Neck:  Midline trachea, normal thyroid, good ROM, no lymphadenopathy Lungs; Clear to auscultation bilaterally Breast:  No dominant palpable mass, retraction, or nipple discharge. Has area of thick fibroglandular tissue in right breast at about 11-12 o'clock.   Cardiovascular: Regular rate and rhythm Abdomen:  Soft, non tender, no hepatosplenomegaly Pelvic:  External genitalia is normal in appearance, no lesions.  The vagina is normal in appearance. Urethra has no lesions or masses. The cervix is bulbous.Pap with high risk HPV 16/18 genotyping performed.  Uterus is felt to be normal size, shape, and contour.  No adnexal masses or tenderness noted.Bladder is non tender, no masses felt. Extremities/musculoskeletal:  No swelling or varicosities noted, no clubbing or cyanosis Psych:  No mood changes, alert and cooperative,seems happy Fall risk is low PHQ 9 score is 10 denies any SI.Is on lexapro and zoloft.  Pt gave verbal  consent for exam without chaperone.   Impression and Plan: 1. Encounter for gynecological examination with Papanicolaou smear of cervix Pap sent Physical in 1 year Pap in 3 if normal Mammogram at 40 Check CBC,CMP,TSH and lipids  2. PMS (premenstrual syndrome) Will continue zoloft Meds ordered this encounter  Medications  . escitalopram (LEXAPRO) 20 MG tablet    Sig: TAKE 1 TABLET(20 MG) BY MOUTH DAILY    Dispense:  90 tablet    Refill:  4    Order Specific Question:   Supervising Provider    Answer:   08/06/2019, LUTHER H [[id number]]  . sertraline (ZOLOFT) 50 MG tablet    Sig: TAKE 1 TABLET(50 MG) BY MOUTH DAILY    Dispense:  30 tablet    Refill:  3    Order Specific Question:   Supervising Provider    Answer:   Despina Hidden, LUTHER H [[id number]]    3. Anxiety Will continue lexapro  4. Decreased libido [employer]  Increase frequency of sex

## 2019-08-13 LAB — CYTOLOGY - PAP
Comment: NEGATIVE
Diagnosis: NEGATIVE
High risk HPV: NEGATIVE

## 2019-08-23 LAB — COMPREHENSIVE METABOLIC PANEL
ALT: 18 IU/L (ref 0–32)
AST: 23 IU/L (ref 0–40)
Albumin/Globulin Ratio: 2.4 — ABNORMAL HIGH (ref 1.2–2.2)
Albumin: 4.8 g/dL (ref 3.8–4.8)
Alkaline Phosphatase: 53 IU/L (ref 39–117)
BUN/Creatinine Ratio: 27 — ABNORMAL HIGH (ref 9–23)
BUN: 17 mg/dL (ref 6–20)
Bilirubin Total: 0.7 mg/dL (ref 0.0–1.2)
CO2: 22 mmol/L (ref 20–29)
Calcium: 10 mg/dL (ref 8.7–10.2)
Chloride: 102 mmol/L (ref 96–106)
Creatinine, Ser: 0.64 mg/dL (ref 0.57–1.00)
GFR calc Af Amer: 136 mL/min/{1.73_m2} (ref >59)
GFR calc non Af Amer: 118 mL/min/{1.73_m2} (ref >59)
Globulin, Total: 2 g/dL (ref 1.5–4.5)
Glucose: 74 mg/dL (ref 65–99)
Potassium: 5 mmol/L (ref 3.5–5.2)
Sodium: 139 mmol/L (ref 134–144)
Total Protein: 6.8 g/dL (ref 6.0–8.5)

## 2019-08-23 LAB — LIPID PANEL
Chol/HDL Ratio: 2.5 ratio (ref 0.0–4.4)
Cholesterol, Total: 189 mg/dL (ref 100–199)
HDL: 76 mg/dL (ref >39)
LDL Chol Calc (NIH): 103 mg/dL — ABNORMAL HIGH (ref 0–99)
Triglycerides: 52 mg/dL (ref 0–149)
VLDL Cholesterol Cal: 10 mg/dL (ref 5–40)

## 2019-08-23 LAB — CBC
Hematocrit: 43.1 % (ref 34.0–46.6)
Hemoglobin: 14.2 g/dL (ref 11.1–15.9)
MCH: 32.1 pg (ref 26.6–33.0)
MCHC: 32.9 g/dL (ref 31.5–35.7)
MCV: 97 fL (ref 79–97)
Platelets: 277 10*3/uL (ref 150–450)
RBC: 4.43 x10E6/uL (ref 3.77–5.28)
RDW: 12.3 % (ref 11.7–15.4)
WBC: 5.6 10*3/uL (ref 3.4–10.8)

## 2019-08-23 LAB — TSH: TSH: 1.06 u[IU]/mL (ref 0.450–4.500)

## 2020-03-17 MED FILL — SERTRALINE HCL 50 MG TABLET: 50 | 30 days supply | Qty: 30 | Fill #0

## 2020-03-17 MED FILL — ESCITALOPRAM 20 MG TABLET: 20 | 90 days supply | Qty: 90 | Fill #0
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
## 2020-12-02 ENCOUNTER — Other Ambulatory Visit (HOSPITAL_COMMUNITY): Payer: Self-pay

## 2020-12-02 ENCOUNTER — Other Ambulatory Visit: Payer: Self-pay | Admitting: Adult Health

## 2020-12-02 MED ORDER — ESCITALOPRAM OXALATE 20 MG PO TABS
20.0000 mg | ORAL_TABLET | Freq: Every day | ORAL | 4 refills | Status: DC
  Filled 2020-12-02: qty 90, 90d supply, fill #0

## 2020-12-02 MED ORDER — SERTRALINE HCL 50 MG PO TABS
50.0000 mg | ORAL_TABLET | Freq: Every day | ORAL | 3 refills | Status: DC
  Filled 2020-12-02: qty 30, 30d supply, fill #0

## 2021-01-12 ENCOUNTER — Ambulatory Visit (INDEPENDENT_AMBULATORY_CARE_PROVIDER_SITE_OTHER): Payer: No Typology Code available for payment source | Admitting: Adult Health

## 2021-01-12 ENCOUNTER — Encounter: Payer: Self-pay | Admitting: Adult Health

## 2021-01-12 ENCOUNTER — Other Ambulatory Visit (HOSPITAL_BASED_OUTPATIENT_CLINIC_OR_DEPARTMENT_OTHER): Payer: Self-pay

## 2021-01-12 ENCOUNTER — Other Ambulatory Visit: Payer: Self-pay

## 2021-01-12 VITALS — BP 116/71 | HR 50 | Ht 65.0 in | Wt 166.0 lb

## 2021-01-12 DIAGNOSIS — Z01419 Encounter for gynecological examination (general) (routine) without abnormal findings: Secondary | ICD-10-CM | POA: Diagnosis not present

## 2021-01-12 DIAGNOSIS — N943 Premenstrual tension syndrome: Secondary | ICD-10-CM

## 2021-01-12 DIAGNOSIS — F419 Anxiety disorder, unspecified: Secondary | ICD-10-CM | POA: Diagnosis not present

## 2021-01-12 MED ORDER — ESCITALOPRAM OXALATE 20 MG PO TABS: 20.0000 mg | ORAL_TABLET | Freq: Every day | ORAL | 4 refills | Status: DC

## 2021-01-12 MED ORDER — ESCITALOPRAM OXALATE 20 MG PO TABS
20.0000 mg | ORAL_TABLET | Freq: Every day | ORAL | 4 refills | Status: DC
  Filled 2021-01-12: qty 90, 90d supply, fill #0

## 2021-01-12 MED ORDER — SERTRALINE HCL 50 MG PO TABS
ORAL_TABLET | ORAL | 3 refills | Status: DC
  Filled 2021-01-12 – 2021-04-15 (×3): qty 90, 90d supply, fill #0

## 2021-01-12 NOTE — Patient Instructions (Signed)
Thank you for choosing our office today! We appreciate the opportunity to meet your healthcare needs. You may receive a short survey by e-mail or through MyChart. If you are happy with your care we would appreciate if you could take just a few minutes to complete the survey questions. We read all of your comments and take your feedback very seriously. Thank you again for choosing our office.  Paige Johnson   [facility] Team  

## 2021-01-12 NOTE — Progress Notes (Signed)
Patient ID: Paige Johnson, female   DOB: 1985-08-06, 35 y.o.   MRN: [id number] History of Present Illness: Paige Johnson is a 35 year old white female, married, [id number] in for a well woman gyn exam. She works from home. She had breast implants in March, Dr Tanna Furry. Lab Results  Component Value Date   DIAGPAP  08/11/2019    - Negative for intraepithelial lesion or malignancy (NILM)   HPVHIGH Negative 08/11/2019   PCP is Dr Ladona Ridgel.   Current Medications, Allergies, Past Medical History, Past Surgical History, Family History and Social History were reviewed in [employer] record.     Review of Systems: Patient denies any headaches, hearing loss, fatigue, blurred vision, shortness of breath, chest pain, abdominal pain, problems with bowel movements, urination, or intercourse. No joint pain or mood swings.  Has to take zoloft more frequently for PMS,seems worse since working from home    Physical Exam:BP 116/71 (BP Location: Right Arm, Patient Position: Sitting, Cuff Size: Normal)   Pulse (!) 50   Ht 5\' 5"  (1.651 m)   Wt 166 lb (75.3 kg)   LMP 12/21/2020   BMI 27.62 kg/m   General:  Well developed, well nourished, no acute distress Skin:  Warm and dry Neck:  Midline trachea, normal thyroid, good ROM, no lymphadenopathy Lungs; Clear to auscultation bilaterally Breast:  No dominant palpable mass, retraction, or nipple discharge,has implants and had lift, scars still healing, has some pulling on the left  Cardiovascular: Regular rate and rhythm Abdomen:  Soft, non tender, no hepatosplenomegaly Pelvic:  External genitalia is normal in appearance, no lesions.  The vagina is normal in appearance. Urethra has no lesions or masses. The cervix is bulbous.  Uterus is felt to be normal size, shape, and contour.  No adnexal masses or tenderness noted.Bladder is non tender, no masses felt. Rectal: Deferred Extremities/musculoskeletal:  No swelling or varicosities noted, no clubbing  or cyanosis Psych:  No mood changes, alert and cooperative,seems happy AA is 1 Fall risk is low Depression screen [facility] 2/9 01/12/2021 08/11/2019 08/11/2019  Decreased Interest 1 2 2   Down, Depressed, Hopeless 2 1 1   PHQ - 2 Score 3 3 3   Altered sleeping 1 2 -  Tired, decreased energy 2 2 -  Change in appetite 0 0 -  Feeling bad or failure about yourself  2 3 -  Trouble concentrating 0 0 -  Moving slowly or fidgety/restless 0 0 -  Suicidal thoughts 0 0 -  PHQ-9 Score 8 10 -  Difficult doing work/chores - - -  Some recent data might be hidden    GAD 7 : Generalized Anxiety Score 01/12/2021  Nervous, Anxious, on Edge 0  Control/stop worrying 1  Worry too much - different things 1  Trouble relaxing 0  Restless 0  Easily annoyed or irritable 1  Afraid - awful might happen 0  Total GAD 7 Score 3      Upstream - 01/12/21 1336       Pregnancy Intention Screening   Does the patient want to become pregnant in the next year? No    Does the patient's partner want to become pregnant in the next year? No    Would the patient like to discuss contraceptive options today? No      Contraception Wrap Up   Current Method Vasectomy    End Method Vasectomy    Contraception Counseling Provided No            Examination  chaperoned by Faith Rogue LPN  Impression and Plan: 1. Encounter for well woman exam with routine gynecological exam Physical in 1 year Pap 2024   2. Anxiety Refilled lexapro   3. PMS (premenstrual syndrome) Refilled zoloft  Meds ordered this encounter  Medications   DISCONTD: escitalopram (LEXAPRO) 20 MG tablet    Sig: Take 1 tablet (20 mg total) by mouth daily.    Dispense:  90 tablet    Refill:  4    Order Specific Question:   Supervising Provider    Answer:   Duane Lope H [[id number]]   sertraline (ZOLOFT) 50 MG tablet    Sig: Take 1 daily as needed for PMS    Dispense:  90 tablet    Refill:  3    Order Specific Question:   Supervising Provider    Answer:    Duane Lope H [[id number]]   escitalopram (LEXAPRO) 20 MG tablet    Sig: Take 1 tablet (20 mg total) by mouth daily.    Dispense:  90 tablet    Refill:  4    Order Specific Question:   Supervising Provider    Answer:   Duane Lope H [[id number]]

## 2021-01-27 ENCOUNTER — Other Ambulatory Visit (HOSPITAL_BASED_OUTPATIENT_CLINIC_OR_DEPARTMENT_OTHER): Payer: Self-pay

## 2021-03-06 IMAGING — US TRANSVAGINAL ULTRASOUND OF PELVIS
1 series · 13 of 25 positions shown · non-contrast
Comparison: None.

CLINICAL DATA: 32-year-old female with right lower quadrant pelvic
pain and fever. Prior history of right tubal ectopic pregnancy.

EXAM:
TRANSABDOMINAL AND TRANSVAGINAL ULTRASOUND OF PELVIS
DOPPLER ULTRASOUND OF OVARIES
TECHNIQUE: Both transabdominal and transvaginal ultrasound examinations of the
pelvis were performed. Transabdominal technique was performed for
global imaging of the pelvis including uterus, ovaries, adnexal
regions, and pelvic cul-de-sac.
It was necessary to proceed with endovaginal exam following the
transabdominal exam to visualize the right ovary. Color and duplex
Doppler ultrasound was utilized to evaluate blood flow to the
ovaries.

[Series 1: transvaginal ultrasound of pelvis · 0.15mm/px · 13 of 218 slices shown]
[im 1/218]
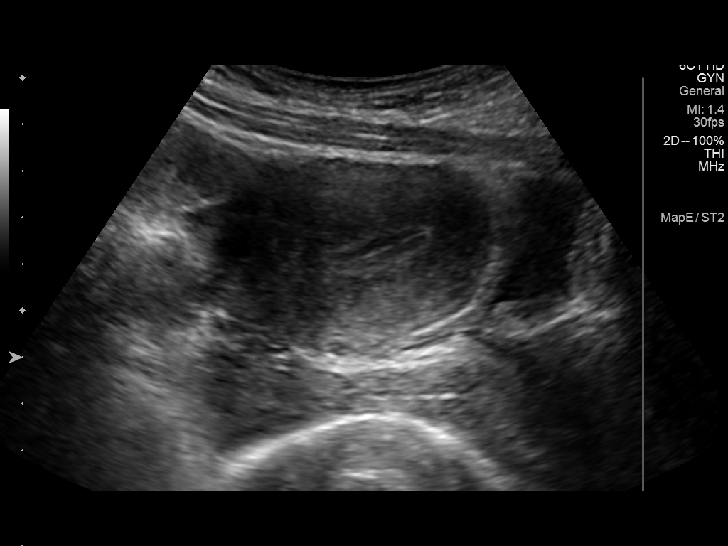
[im 19/218]
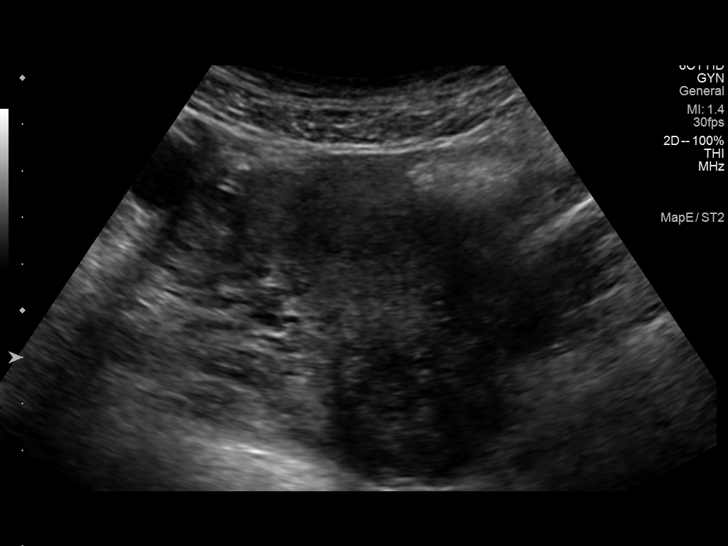
[im 37/218]
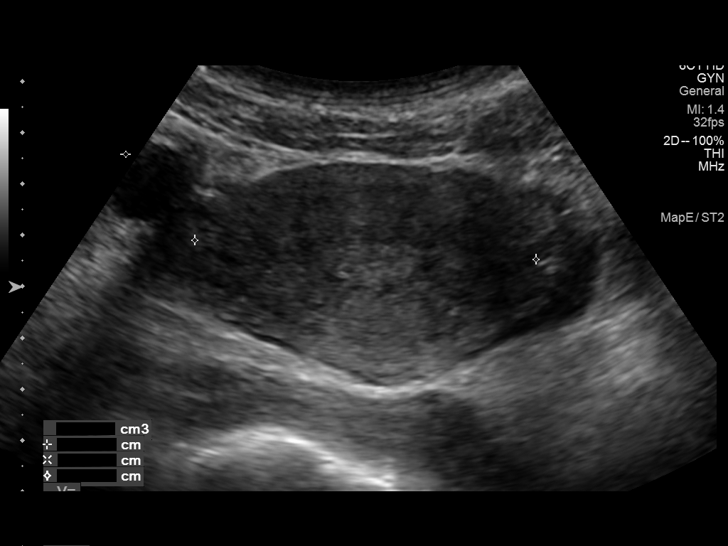
[im 55/218]
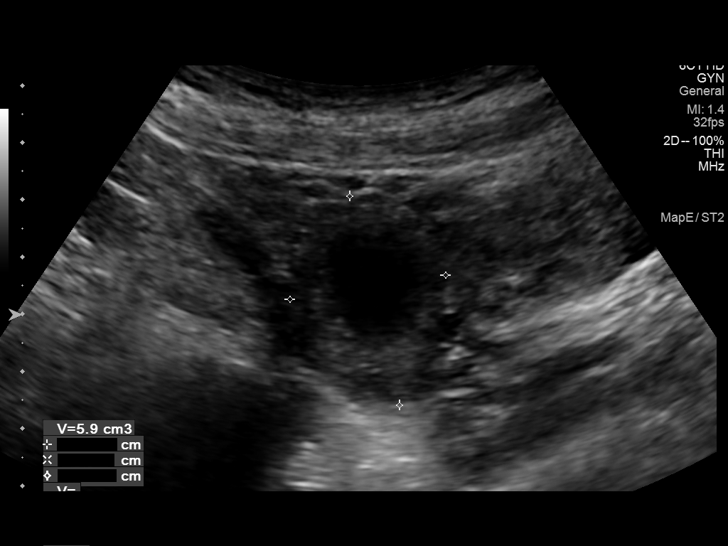
[im 73/218]
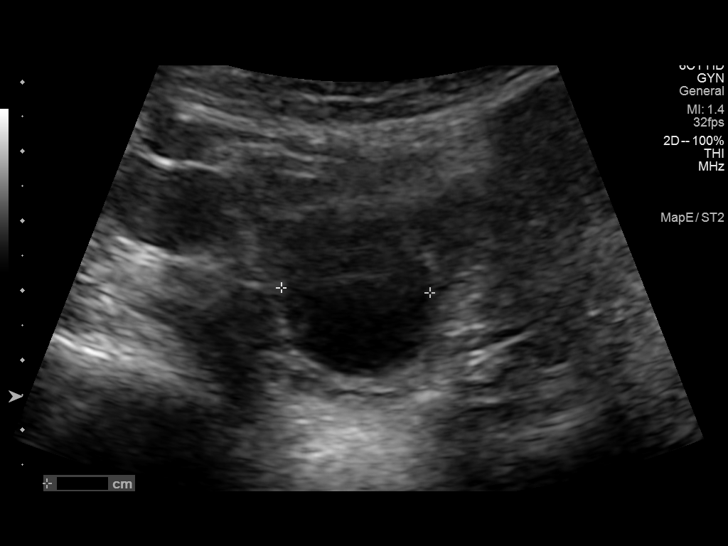
[im 91/218]
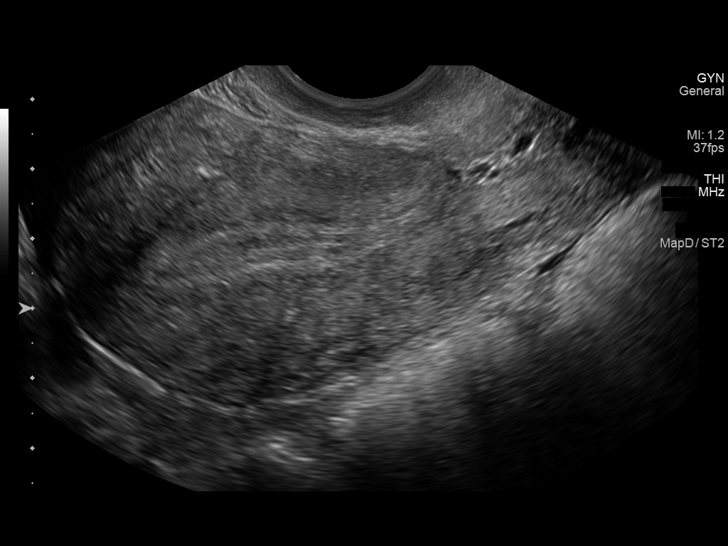
[im 109/218]
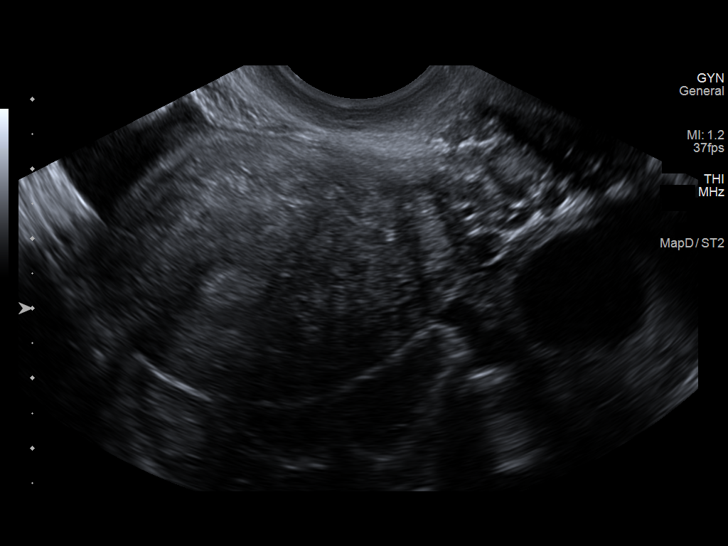
[im 127/218]
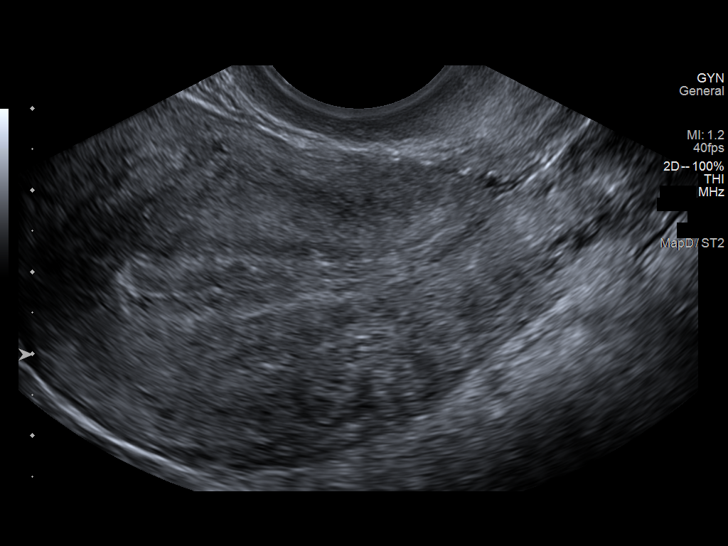
[im 145/218]
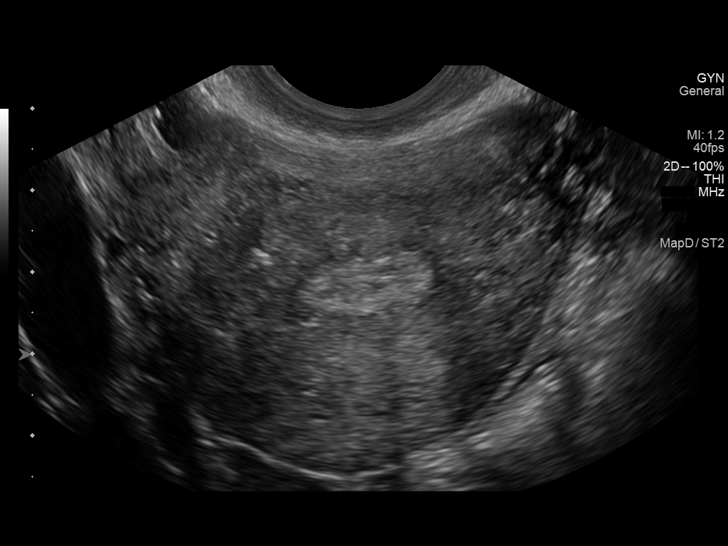
[im 163/218]
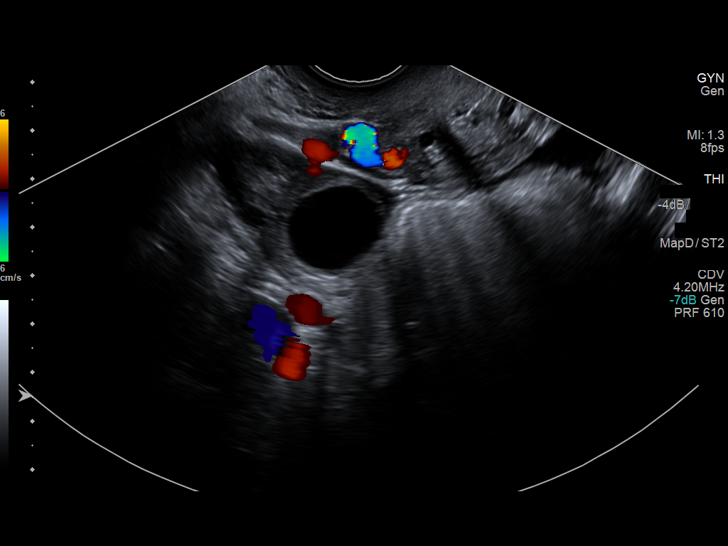
[im 181/218]
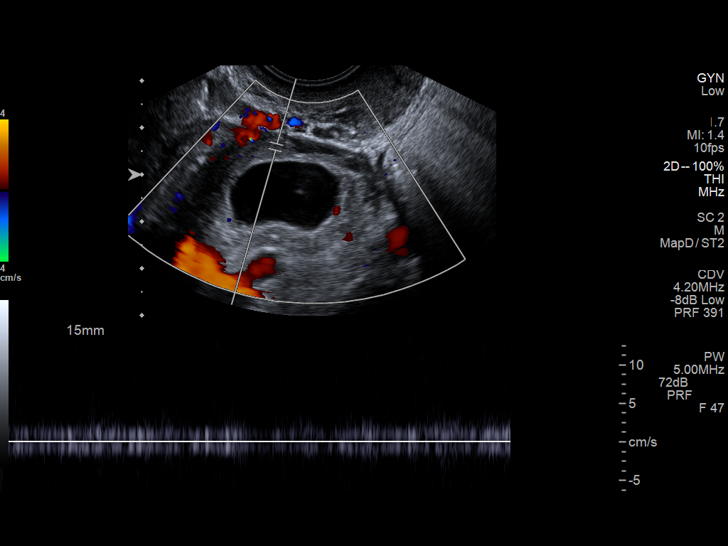
[im 199/218]
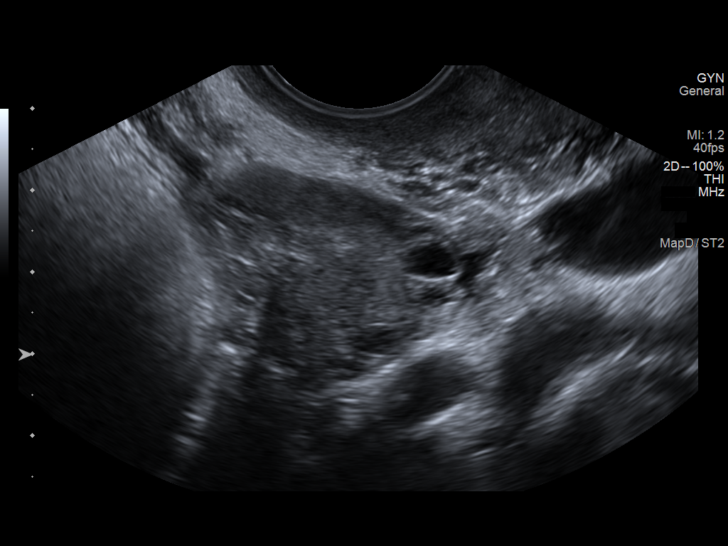
[im 218/218]
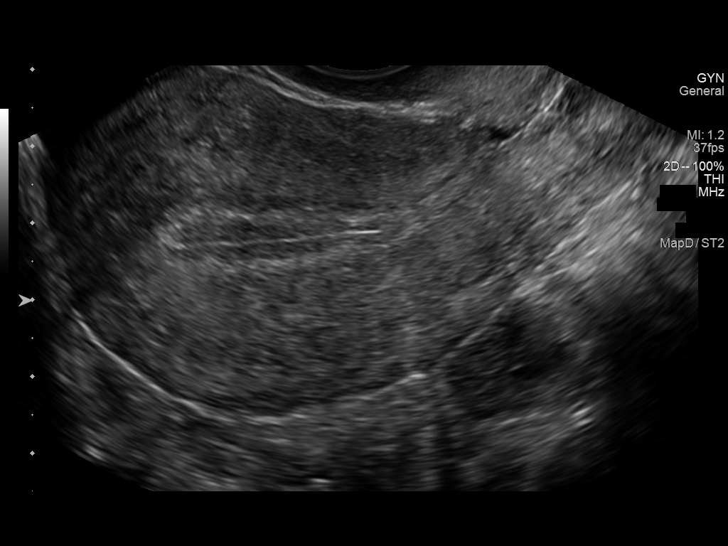

[13 of 25 positions shown; findings below may reference images not displayed]

FINDINGS: Uterus

Measurements: 8.1 x 4.3 x 5.6 cm = volume: 103 mL. No fibroids or
other mass visualized.

Endometrium

Thickness: 9 mm.  No focal abnormality visualized.

Right ovary

Measurements: 4.2 x 3.9 x 4.2 cm = volume: 23 mL. Normal
appearance/no adnexal mass. Incidental note is made of a simple
follicular cyst measuring up to 2.5 cm in diameter. This is a normal
finding in a reproductive age female.

Left ovary

Measurements: 3.6 x 2.9 x 2.8 cm = volume: 15 mL. Normal
appearance/no adnexal mass.

Pulsed Doppler evaluation of both ovaries demonstrates normal
low-resistance arterial and venous waveforms.

Other findings

Small volume free fluid in the pelvis is likely physiologic.
IMPRESSION: 1. No evidence of ovarian torsion or other acute abnormality at this
time.
2. Incidental note is made of a 2.5 cm simple follicular cyst in the
right ovary. This is considered a normal finding in a reproductive
age female.

## 2021-03-07 ENCOUNTER — Telehealth: Payer: No Typology Code available for payment source | Admitting: Physician Assistant

## 2021-03-07 DIAGNOSIS — B9689 Other specified bacterial agents as the cause of diseases classified elsewhere: Secondary | ICD-10-CM

## 2021-03-07 DIAGNOSIS — J069 Acute upper respiratory infection, unspecified: Secondary | ICD-10-CM

## 2021-03-07 MED ORDER — AZITHROMYCIN 250 MG PO TABS: ORAL_TABLET | ORAL | 0 refills | Status: AC

## 2021-03-07 NOTE — Progress Notes (Signed)
I have spent 5 minutes in review of e-visit questionnaire, review and updating patient chart, medical decision making and response to patient.   Trey Gulbranson Cody Jesyca Weisenburger, PA-C    

## 2021-03-07 NOTE — Progress Notes (Signed)
We are sorry that you are not feeling well.  Here is how we plan to help!  Based on your presentation I believe you most likely have A cough due to bacteria.  If your symptoms do not improve with your treatment plan it is important that you contact your provider.   I have prescribed Azithromyin 250 mg: two tablets now and then one tablet daily for 4 additonal days    In addition you may use A non-prescription cough medication called Mucinex DM: take 2 tablets every 12 hours.   From your responses in the eVisit questionnaire you describe inflammation in the upper respiratory tract which is causing a significant cough.  This is commonly called Bronchitis and has four common causes:   Allergies Viral Infections Acid Reflux Bacterial Infection Allergies, viruses and acid reflux are treated by controlling symptoms or eliminating the cause. An example might be a cough caused by taking certain blood pressure medications. You stop the cough by changing the medication. Another example might be a cough caused by acid reflux. Controlling the reflux helps control the cough.  USE OF BRONCHODILATOR ("RESCUE") INHALERS: There is a risk from using your bronchodilator too frequently.  The risk is that over-reliance on a medication which only relaxes the muscles surrounding the breathing tubes can reduce the effectiveness of medications prescribed to reduce swelling and congestion of the tubes themselves.  Although you feel brief relief from the bronchodilator inhaler, your asthma may actually be worsening with the tubes becoming more swollen and filled with mucus.  This can delay other crucial treatments, such as oral steroid medications. If you need to use a bronchodilator inhaler daily, several times per day, you should discuss this with your provider.  There are probably better treatments that could be used to keep your asthma under control.     HOME CARE Only take medications as instructed by your medical  team. Complete the entire course of an antibiotic. Drink plenty of fluids and get plenty of rest. Avoid close contacts especially the very young and the elderly Cover your mouth if you cough or cough into your sleeve. Always remember to wash your hands A steam or ultrasonic humidifier can help congestion.   GET HELP RIGHT AWAY IF: You develop worsening fever. You become short of breath You cough up blood. Your symptoms persist after you have completed your treatment plan MAKE SURE YOU  Understand these instructions. Will watch your condition. Will get help right away if you are not doing well or get worse.    Thank you for choosing an e-visit.  Your e-visit answers were reviewed by a board certified advanced clinical practitioner to complete your personal care plan. Depending upon the condition, your plan could have included both over the counter or prescription medications.  Please review your pharmacy choice. Make sure the pharmacy is open so you can pick up prescription now. If there is a problem, you may contact your provider through [employer] and have the prescription routed to another pharmacy.  Your safety is important to [place]. If you have drug allergies check your prescription carefully.   For the next 24 hours you can use MyChart to ask questions about today's visit, request a non-urgent call back, or ask for a work or school excuse. You will get an email in the next two days asking about your experience. I hope that your e-visit has been valuable and will speed your recovery.

## 2021-04-13 ENCOUNTER — Other Ambulatory Visit: Payer: Self-pay

## 2021-04-15 ENCOUNTER — Other Ambulatory Visit: Payer: Self-pay

## 2021-04-18 ENCOUNTER — Other Ambulatory Visit: Payer: Self-pay | Admitting: Adult Health

## 2021-04-18 ENCOUNTER — Other Ambulatory Visit (HOSPITAL_COMMUNITY): Payer: Self-pay

## 2021-04-18 MED ORDER — SERTRALINE HCL 50 MG PO TABS
50.0000 mg | ORAL_TABLET | Freq: Every day | ORAL | 3 refills | Status: DC | PRN
  Filled 2021-04-18: qty 90, 90d supply, fill #0
  Filled 2022-02-15: qty 90, 90d supply, fill #1

## 2021-04-18 MED ORDER — ESCITALOPRAM OXALATE 20 MG PO TABS
20.0000 mg | ORAL_TABLET | Freq: Every day | ORAL | 4 refills | Status: DC
  Filled 2021-04-18: qty 90, 90d supply, fill #0

## 2021-04-18 NOTE — Progress Notes (Signed)
Refilled zoloft and lexapro

## 2022-02-16 ENCOUNTER — Other Ambulatory Visit (HOSPITAL_COMMUNITY): Payer: Self-pay

## 2022-03-02 ENCOUNTER — Other Ambulatory Visit (HOSPITAL_COMMUNITY): Payer: Self-pay

## 2022-03-14 ENCOUNTER — Encounter: Payer: Self-pay | Admitting: Adult Health

## 2022-03-14 ENCOUNTER — Ambulatory Visit (INDEPENDENT_AMBULATORY_CARE_PROVIDER_SITE_OTHER): Payer: No Typology Code available for payment source | Admitting: Adult Health

## 2022-03-14 ENCOUNTER — Other Ambulatory Visit (HOSPITAL_COMMUNITY)
Admission: RE | Admit: 2022-03-14 | Discharge: 2022-03-14 | Disposition: A | Discharge status: Home / Self Care | Payer: No Typology Code available for payment source | Source: Ambulatory Visit | Attending: Adult Health | Admitting: Adult Health

## 2022-03-14 VITALS — BP 116/64 | HR 56 | Ht 65.0 in | Wt 165.0 lb

## 2022-03-14 DIAGNOSIS — F32A Depression, unspecified: Secondary | ICD-10-CM | POA: Insufficient documentation

## 2022-03-14 DIAGNOSIS — Z01419 Encounter for gynecological examination (general) (routine) without abnormal findings: Secondary | ICD-10-CM | POA: Insufficient documentation

## 2022-03-14 DIAGNOSIS — F419 Anxiety disorder, unspecified: Secondary | ICD-10-CM

## 2022-03-14 DIAGNOSIS — R4589 Other symptoms and signs involving emotional state: Secondary | ICD-10-CM | POA: Diagnosis not present

## 2022-03-14 DIAGNOSIS — R635 Abnormal weight gain: Secondary | ICD-10-CM | POA: Diagnosis not present

## 2022-03-14 MED ORDER — BUPROPION HCL ER (XL) 150 MG PO TB24: 150.0000 mg | ORAL_TABLET | Freq: Every day | ORAL | 3 refills | Status: DC

## 2022-03-14 NOTE — Progress Notes (Signed)
Patient ID: Paige Johnson, female   DOB: [birth year], 36 y.o.   MRN: [id number] History of Present Illness: Paige Johnson is a 36 year old white female, married, [id number] in for well woman gyn exam and pap. She is working from home, and works out 5 days a week, has cut carbs and drinks water and still has gained weight. She is stressed with teenage daughter.     Current Medications, Allergies, Past Medical History, Past Surgical History, Family History and Social History were reviewed in [employer] record.     Review of Systems: Patient denies any headaches, hearing loss, fatigue, blurred vision, shortness of breath, chest pain, abdominal pain, problems with bowel movements, urination, or intercourse. No joint pain or mood swings.  See HPI for positives.   Physical Exam:BP 116/64 (BP Location: Left Arm, Patient Position: Sitting, Cuff Size: Normal)   Pulse (!) 56   Ht 5\' 5"  (1.651 m)   Wt 165 lb (74.8 kg)   LMP 02/22/2022 (Approximate)   BMI 27.46 kg/m   General:  Well developed, well nourished, no acute distress Skin:  Warm and dry Neck:  Midline trachea, normal thyroid, good ROM, no lymphadenopathy Lungs; Clear to auscultation bilaterally Breast:  No dominant palpable mass, retraction, or nipple discharge, has bilateral implants Cardiovascular: Regular rate and rhythm Abdomen:  Soft, non tender, no hepatosplenomegaly Pelvic:  External genitalia is normal in appearance, no lesions.  The vagina is normal in appearance. Urethra has no lesions or masses. The cervix is bulbous.pap with HR HPV genotyping performed.   Uterus is felt to be normal size, shape, and contour.  No adnexal masses or tenderness noted.Bladder is non tender, no masses felt. Extremities/musculoskeletal:  No swelling or varicosities noted, no clubbing or cyanosis Psych:  No mood changes, alert and cooperative,seems happy AA is 2 Fall risk is low    03/14/2022    3:42 PM 01/12/2021    1:36 PM  08/11/2019    3:27 PM  Depression screen PHQ 2/9  Decreased Interest 1 1 2   Down, Depressed, Hopeless 2 2 1   PHQ - 2 Score 3 3 3   Altered sleeping 1 1 2   Tired, decreased energy 2 2 2   Change in appetite 0 0 0  Feeling bad or failure about yourself  2 2 3   Trouble concentrating 1 0 0  Moving slowly or fidgety/restless 0 0 0  Suicidal thoughts 0 0 0  PHQ-9 Score 9 8 10        03/14/2022    3:43 PM 01/12/2021    1:36 PM  GAD 7 : Generalized Anxiety Score  Nervous, Anxious, on Edge 0 0  Control/stop worrying 0 1  Worry too much - different things 1 1  Trouble relaxing 0 0  Restless 0 0  Easily annoyed or irritable 2 1  Afraid - awful might happen 0 0  Total GAD 7 Score 3 3      Upstream - 03/14/22 1542       Pregnancy Intention Screening   Does the patient want to become pregnant in the next year? No    Does the patient's partner want to become pregnant in the next year? No    Would the patient like to discuss contraceptive options today? No      Contraception Wrap Up   Current Method Vasectomy    End Method Vasectomy            Examination chaperoned by LPN  Impression and  Plan:  1. Encounter for gynecological examination with Papanicolaou smear of cervix Pap sent Physical in 1 year Pap in 3 years if normal   2. Anxiety and depression +stress +moody Has tried lexapro and zoloft in the past Will try Wellbutrin XL150 mg 1 daily  Meds ordered this encounter  Medications   buPROPion (WELLBUTRIN XL) 150 MG 24 hr tablet    Sig: Take 1 tablet (150 mg total) by mouth daily.    Dispense:  30 tablet    Refill:  3    Order Specific Question:   Supervising Provider    Answer:   Tania Ade H [[id number]]    Will follow up in 6 weeks   3. Lisbeth Renshaw Will try Wellbutrin   4. Weight gain Continue working out, low carb and water

## 2022-03-17 LAB — CYTOLOGY - PAP
Comment: NEGATIVE
Diagnosis: NEGATIVE
High risk HPV: NEGATIVE

## 2022-04-05 ENCOUNTER — Other Ambulatory Visit: Payer: Self-pay | Admitting: Adult Health

## 2022-04-05 MED ORDER — BUPROPION HCL ER (XL) 300 MG PO TB24: 300.0000 mg | ORAL_TABLET | ORAL | 3 refills | Status: DC

## 2022-04-05 NOTE — Progress Notes (Signed)
Increased Wellbutrin XL to 300 mg

## 2022-04-25 ENCOUNTER — Ambulatory Visit (INDEPENDENT_AMBULATORY_CARE_PROVIDER_SITE_OTHER): Payer: No Typology Code available for payment source | Admitting: Adult Health

## 2022-04-25 ENCOUNTER — Encounter: Payer: Self-pay | Admitting: Adult Health

## 2022-04-25 VITALS — BP 99/58 | HR 63 | Ht 65.0 in | Wt 162.0 lb

## 2022-04-25 DIAGNOSIS — F419 Anxiety disorder, unspecified: Secondary | ICD-10-CM

## 2022-04-25 DIAGNOSIS — F32A Depression, unspecified: Secondary | ICD-10-CM

## 2022-04-25 MED ORDER — BUPROPION HCL ER (XL) 300 MG PO TB24: 300.0000 mg | ORAL_TABLET | ORAL | 3 refills | Status: DC

## 2022-04-25 MED ORDER — BUSPIRONE HCL 5 MG PO TABS: 5.0000 mg | ORAL_TABLET | Freq: Three times a day (TID) | ORAL | 2 refills | Status: DC

## 2022-04-25 NOTE — Progress Notes (Signed)
Subjective:     Patient ID: Assunta Curtis, female   DOB: 16-May-1986, 36 y.o.   MRN: [id number]  HPI [place] is a 36 year old white female,married, [id number], back in follow up on taking Wellbutrin, not happy.     Component Value Date/Time   DIAGPAP  03/14/2022 1611    - Negative for intraepithelial lesion or malignancy (NILM)   DIAGPAP  08/11/2019 1529    - Negative for intraepithelial lesion or malignancy (NILM)   HPVHIGH Negative 03/14/2022 1611   HPVHIGH Negative 08/11/2019 1529   ADEQPAP  03/14/2022 1611    Satisfactory for evaluation; transformation zone component PRESENT.   ADEQPAP  08/11/2019 1529    Satisfactory for evaluation; transformation zone component PRESENT.    Review of Systems Not happy Teary at times Not as hungry, which is good she says  Reviewed past medical,surgical, social and family history. Reviewed medications and allergies.     Objective:   Physical Exam BP (!) 99/58 (BP Location: Left Arm, Patient Position: Sitting, Cuff Size: Normal)   Pulse 63   Ht 5\' 5"  (1.651 m)   Wt 162 lb (73.5 kg)   LMP 04/17/2022   BMI 26.96 kg/m     Skin warm and dry. Lungs: clear to ausculation bilaterally. Cardiovascular: regular rate and rhythm.     04/25/2022    4:24 PM 03/14/2022    3:42 PM 01/12/2021    1:36 PM  Depression screen PHQ 2/9  Decreased Interest 1 1 1   Down, Depressed, Hopeless 1 2 2   PHQ - 2 Score 2 3 3   Altered sleeping 0 1 1  Tired, decreased energy 1 2 2   Change in appetite 0 0 0  Feeling bad or failure about yourself  1 2 2   Trouble concentrating 0 1 0  Moving slowly or fidgety/restless 0 0 0  Suicidal thoughts 0 0 0  PHQ-9 Score 4 9 8        04/25/2022    4:26 PM 03/14/2022    3:43 PM 01/12/2021    1:36 PM  GAD 7 : Generalized Anxiety Score  Nervous, Anxious, on Edge 1 0 0  Control/stop worrying 1 0 1  Worry too much - different things 1 1 1   Trouble relaxing 1 0 0  Restless 0 0 0  Easily annoyed or irritable 2 2 1   Afraid - awful  might happen 0 0 0  Total GAD 7 Score 6 3 3     Upstream - 04/25/22 1623       Pregnancy Intention Screening   Does the patient want to become pregnant in the next year? No    Does the patient's partner want to become pregnant in the next year? No    Would the patient like to discuss contraceptive options today? No      Contraception Wrap Up   Current Method Vasectomy   vasectomy   End Method Vasectomy   vasectomy   Contraception Counseling Provided No               Assessment:     1. Anxiety and depression Does not feel happy, teary at times Will continue Wellbutrin and add Buspar 5 mg 1 tid  Meds ordered this encounter  Medications   buPROPion (WELLBUTRIN XL) 300 MG 24 hr tablet    Sig: Take 1 tablet (300 mg total) by mouth every morning.    Dispense:  30 tablet    Refill:  3    Order Specific Question:  Supervising Provider    Answer:   Duane Lope H [[id number]]   busPIRone (BUSPAR) 5 MG tablet    Sig: Take 1 tablet (5 mg total) by mouth 3 (three) times daily.    Dispense:  90 tablet    Refill:  2    Order Specific Question:   Supervising Provider    Answer:   Lazaro Arms [[id number]]   She declines counseling Needs to take time for self    Plan:     Follow up 05/31/22

## 2022-05-17 ENCOUNTER — Other Ambulatory Visit: Payer: Self-pay | Admitting: Adult Health

## 2022-05-17 ENCOUNTER — Other Ambulatory Visit (HOSPITAL_COMMUNITY): Payer: Self-pay

## 2022-05-17 MED ORDER — BUPROPION HCL ER (XL) 300 MG PO TB24
300.0000 mg | ORAL_TABLET | ORAL | 3 refills | Status: DC
  Filled 2022-05-17: qty 30, 30d supply, fill #0

## 2022-05-17 NOTE — Progress Notes (Signed)
Rx for Wellbutrin sent to [facility]

## 2022-05-22 ENCOUNTER — Other Ambulatory Visit (HOSPITAL_COMMUNITY): Payer: Self-pay

## 2022-05-30 ENCOUNTER — Encounter: Payer: Self-pay | Admitting: Adult Health

## 2022-05-30 ENCOUNTER — Ambulatory Visit (INDEPENDENT_AMBULATORY_CARE_PROVIDER_SITE_OTHER): Payer: No Typology Code available for payment source | Admitting: Adult Health

## 2022-05-30 VITALS — Ht 65.0 in | Wt 159.0 lb

## 2022-05-30 DIAGNOSIS — F32A Depression, unspecified: Secondary | ICD-10-CM

## 2022-05-30 DIAGNOSIS — F419 Anxiety disorder, unspecified: Secondary | ICD-10-CM

## 2022-05-30 MED ORDER — TRAZODONE HCL 50 MG PO TABS: 50.0000 mg | ORAL_TABLET | Freq: Every day | ORAL | 3 refills | Status: DC

## 2022-05-30 NOTE — Progress Notes (Signed)
Patient ID: Paige Johnson, female   DOB: [birth year], 36 y.o.   MRN: [id number]   TELEHEALTH GYNECOLOGY VISIT ENCOUNTER NOTE  Provider location: [facility]   Patient location: Home  I connected with Paige Johnson on 05/30/22 at  4:10 PM EST by telephone and verified that I am speaking with the correct person using two identifiers. Patient was unable to do MyChart audiovisual encounter due to technical difficulties, she tried several times.    I discussed the limitations, risks, security and privacy concerns of performing an evaluation and management service by telephone and the availability of in person appointments. I also discussed with the patient that there may be a patient responsible charge related to this service. The patient expressed understanding and agreed to proceed.   History:  Paige Johnson is a 36 y.o. [phone] female being evaluated today for anxiety and depression and not feeling better, but was without Wellbutrin for 2 weeks had to mail order and buspar makes her jittery.Does not sleep well. She denies any SI or other concerns.       Past Medical History:  Diagnosis Date   Anxiety 08/30/2015   Bilateral leg pain 08/30/2015   Achy, worse at time of period, stands 14 hours a day   Pelvic pain in female 08/30/2015   PMS (premenstrual syndrome) 08/30/2015   Past Surgical History:  Procedure Laterality Date   BREAST ENHANCEMENT SURGERY Bilateral    CESAREAN SECTION  06/12/2008   ECTOPIC PREGNANCY SURGERY  06/12/2004   The following portions of the patient's history were reviewed and updated as appropriate: allergies, current medications, past family history, past medical history, past social history, past surgical history and problem list.   Health Maintenance:  Normal pap and negative HRHPV on 03/14/22   Review of Systems:  Pertinent items noted in HPI and remainder of comprehensive ROS otherwise negative.  Physical Exam:    General:  Alert, oriented and cooperative.   Mental Status: Normal mood and affect perceived. Normal judgment and thought content.  Physical exam deferred due to nature of the encounter Ht 5\' 5"  (1.651 m)   Wt 159 lb (72.1 kg)   BMI 26.46 kg/m      05/30/2022    4:21 PM 04/25/2022    4:24 PM 03/14/2022    3:42 PM  Depression screen PHQ 2/9  Decreased Interest 2 1 1   Down, Depressed, Hopeless 2 1 2   PHQ - 2 Score 4 2 3   Altered sleeping 0 0 1  Tired, decreased energy 3 1 2   Change in appetite 0 0 0  Feeling bad or failure about yourself  2 1 2   Trouble concentrating 2 0 1  Moving slowly or fidgety/restless 0 0 0  Suicidal thoughts 0 0 0  PHQ-9 Score 11 4 9        05/30/2022    4:23 PM 04/25/2022    4:26 PM 03/14/2022    3:43 PM 01/12/2021    1:36 PM  GAD 7 : Generalized Anxiety Score  Nervous, Anxious, on Edge 1 1 0 0  Control/stop worrying 2 1 0 1  Worry too much - different things 2 1 1 1   Trouble relaxing 2 1 0 0  Restless 0 0 0 0  Easily annoyed or irritable 3 2 2 1   Afraid - awful might happen 0 0 0 0  Total GAD 7 Score 10 6 3 3       Upstream - 05/30/22 1617  Pregnancy Intention Screening   Does the patient want to become pregnant in the next year? No    Does the patient's partner want to become pregnant in the next year? No    Would the patient like to discuss contraceptive options today? No      Contraception Wrap Up   Current Method Vasectomy    End Method Vasectomy    Contraception Counseling Provided No             Labs and Imaging No results found for this or any previous visit (from the past 336 hour(s)). No results found.    Assessment and Plan:     1. Anxiety and depression Will continue Wellbutrin 300 mg XL daily , has refills Will stop Buspar And try trazodone 50 mg 1 at HS Meds ordered this encounter  Medications   traZODone (DESYREL) 50 MG tablet    Sig: Take 1 tablet (50 mg total) by mouth at bedtime.    Dispense:  30  tablet    Refill:  3    Order Specific Question:   Supervising Provider    Answer:   Tania Ade H [[id number]]   Try EAP  Follow up in 6 weeks for ROS      I discussed the assessment and treatment plan with the patient. The patient was provided an opportunity to ask questions and all were answered. The patient agreed with the plan and demonstrated an understanding of the instructions.   The patient was advised to call back or seek an in-person evaluation/go to the ED if the symptoms worsen or if the condition fails to improve as anticipated.  I provided 10  minutes of non-face-to-face time during this encounter. I was in my office at [facility] for this encounter.  Derrek Monaco, NP [facility], [place]

## 2022-06-14 ENCOUNTER — Telehealth: Payer: 59 | Admitting: Nurse Practitioner

## 2022-06-14 DIAGNOSIS — J0140 Acute pansinusitis, unspecified: Secondary | ICD-10-CM

## 2022-06-14 MED ORDER — AMOXICILLIN-POT CLAVULANATE 875-125 MG PO TABS: 1.0000 | ORAL_TABLET | Freq: Two times a day (BID) | ORAL | 0 refills | Status: AC

## 2022-06-14 NOTE — Progress Notes (Signed)

## 2022-08-30 ENCOUNTER — Telehealth: Payer: 59 | Admitting: Physician Assistant

## 2022-08-30 DIAGNOSIS — J019 Acute sinusitis, unspecified: Secondary | ICD-10-CM | POA: Diagnosis not present

## 2022-08-30 DIAGNOSIS — B9689 Other specified bacterial agents as the cause of diseases classified elsewhere: Secondary | ICD-10-CM | POA: Diagnosis not present

## 2022-08-30 MED ORDER — AMOXICILLIN-POT CLAVULANATE 875-125 MG PO TABS: 1.0000 | ORAL_TABLET | Freq: Two times a day (BID) | ORAL | 0 refills | Status: DC

## 2022-08-30 NOTE — Progress Notes (Signed)

## 2023-04-10 ENCOUNTER — Ambulatory Visit (INDEPENDENT_AMBULATORY_CARE_PROVIDER_SITE_OTHER): Payer: 59 | Admitting: Adult Health

## 2023-04-10 ENCOUNTER — Encounter: Payer: Self-pay | Admitting: Adult Health

## 2023-04-10 VITALS — BP 112/70 | HR 62 | Ht 65.0 in | Wt 156.5 lb

## 2023-04-10 DIAGNOSIS — Z1329 Encounter for screening for other suspected endocrine disorder: Secondary | ICD-10-CM | POA: Insufficient documentation

## 2023-04-10 DIAGNOSIS — F419 Anxiety disorder, unspecified: Secondary | ICD-10-CM | POA: Diagnosis not present

## 2023-04-10 DIAGNOSIS — N943 Premenstrual tension syndrome: Secondary | ICD-10-CM | POA: Diagnosis not present

## 2023-04-10 DIAGNOSIS — Z13220 Encounter for screening for lipoid disorders: Secondary | ICD-10-CM | POA: Diagnosis not present

## 2023-04-10 DIAGNOSIS — F32A Depression, unspecified: Secondary | ICD-10-CM

## 2023-04-10 DIAGNOSIS — Z01419 Encounter for gynecological examination (general) (routine) without abnormal findings: Secondary | ICD-10-CM | POA: Diagnosis not present

## 2023-04-10 MED ORDER — FLUOXETINE HCL 20 MG PO CAPS
ORAL_CAPSULE | ORAL | 3 refills | Status: DC
Start: 2023-04-10 — End: 2024-01-08

## 2023-04-10 NOTE — Progress Notes (Signed)
Patient ID: Paige Johnson, female   DOB: 02/26/1986, 37 y.o.   MRN: [id number] History of Present Illness: Paige Johnson is a 37 year old white female, married, [id number], in for a well woman gyn exam. She says she is mean and wants to eat, for 2 weeks before period. She is working from home. She requests labs.  She is working out 5 days a week and has lost about 10 lbs in last year. Has had bad week had to put [place] down.      Component Value Date/Time   DIAGPAP  03/14/2022 1611    - Negative for intraepithelial lesion or malignancy (NILM)   DIAGPAP  08/11/2019 1529    - Negative for intraepithelial lesion or malignancy (NILM)   HPVHIGH Negative 03/14/2022 1611   HPVHIGH Negative 08/11/2019 1529   ADEQPAP  03/14/2022 1611    Satisfactory for evaluation; transformation zone component PRESENT.   ADEQPAP  08/11/2019 1529    Satisfactory for evaluation; transformation zone component PRESENT.    High Risk HPV: Positive  Adequacy:  Satisfactory for evaluation, transformation zone component PRESENT  Diagnosis:  Atypical squamous cells of undetermined significance (ASC-US)    Current Medications, Allergies, Past Medical History, Past Surgical History, Family History and Social History were reviewed in [employer] record.     Review of Systems: Patient denies any headaches, hearing loss, fatigue, blurred vision, shortness of breath, chest pain, abdominal pain, problems with bowel movements, urination, or intercourse. No joint pain or mood swings.  See HPI for positives   Physical Exam:BP 112/70 (BP Location: Left Arm, Patient Position: Sitting, Cuff Size: Normal)   Pulse 62   Ht 5\' 5"  (1.651 m)   Wt 156 lb 8 oz (71 kg)   LMP 03/28/2023   BMI 26.04 kg/m   General:  Well developed, well nourished, no acute distress Skin:  Warm and dry Neck:  Midline trachea, normal thyroid, good ROM, no lymphadenopathy Lungs; Clear to auscultation bilaterally Breast:  No  dominant palpable mass, retraction, or nipple discharge, has bilateral implants Cardiovascular: Regular rate and rhythm Abdomen:  Soft, non tender, no hepatosplenomegaly Pelvic:  External genitalia is normal in appearance, no lesions.  The vagina is normal in appearance. Urethra has no lesions or masses. The cervix is smooth.  Uterus is felt to be normal size, shape, and contour.  No adnexal masses or tenderness noted.Bladder is non tender, no masses felt. Extremities/musculoskeletal:  No swelling or varicosities noted, no clubbing or cyanosis Psych:  No mood changes, alert and cooperative,seems happy AA is 1 Fall risk is low    04/10/2023    3:59 PM 05/30/2022    4:21 PM 04/25/2022    4:24 PM  Depression screen PHQ 2/9  Decreased Interest 1 2 1   Down, Depressed, Hopeless 2 2 1   PHQ - 2 Score 3 4 2   Altered sleeping 2 0 0  Tired, decreased energy 2 3 1   Change in appetite 0 0 0  Feeling bad or failure about yourself  3 2 1   Trouble concentrating 2 2 0  Moving slowly or fidgety/restless 0 0 0  Suicidal thoughts 0 0 0  PHQ-9 Score 12 11 4        04/10/2023    3:59 PM 05/30/2022    4:23 PM 04/25/2022    4:26 PM 03/14/2022    3:43 PM  GAD 7 : Generalized Anxiety Score  Nervous, Anxious, on Edge 3 1 1  0  Control/stop worrying 3 2  1 0  Worry too much - different things 3 2 1 1   Trouble relaxing 1 2 1  0  Restless 1 0 0 0  Easily annoyed or irritable 3 3 2 2   Afraid - awful might happen 2 0 0 0  Total GAD 7 Score 16 10 6 3     Upstream - 04/10/23 1558       Pregnancy Intention Screening   Does the patient want to become pregnant in the next year? No    Does the patient's partner want to become pregnant in the next year? No    Would the patient like to discuss contraceptive options today? No      Contraception Wrap Up   Current Method Vasectomy    End Method Vasectomy    Contraception Counseling Provided No            Examination chaperoned by Malachy Mood LPN     Impression and Plan: 1. Well woman exam with routine gynecological exam Physical in 1 year Pap in 2026 Will check labs  - CBC - Comprehensive metabolic panel - Lipid panel  2. PMS (premenstrual syndrome) Is mean and wants to eat 2 weeks before her period Will try Prozac, 20 mg 1 daily for 2 weeks before period  Meds ordered this encounter  Medications   FLUoxetine (PROZAC) 20 MG capsule    Sig: Take 1 daily for 2 weeks before period    Dispense:  30 capsule    Refill:  3    Order Specific Question:   Supervising Provider    Answer:   Lazaro Arms [[id number]]    Let me know by MyChart if helps or not, could take every day if desired  3. Anxiety and depression She has tried meds in past that did not really help  4. Screening cholesterol level - Lipid panel  5. Screening for thyroid disorder - TSH + free T4

## 2023-04-11 LAB — CBC
Hematocrit: 39.5 % (ref 34.0–46.6)
Hemoglobin: 13.2 g/dL (ref 11.1–15.9)
MCH: 32.6 pg (ref 26.6–33.0)
MCHC: 33.4 g/dL (ref 31.5–35.7)
MCV: 98 fL — ABNORMAL HIGH (ref 79–97)
Platelets: 308 10*3/uL (ref 150–450)
RBC: 4.05 x10E6/uL (ref 3.77–5.28)
RDW: 12.3 % (ref 11.7–15.4)
WBC: 6.6 10*3/uL (ref 3.4–10.8)

## 2023-04-11 LAB — COMPREHENSIVE METABOLIC PANEL
ALT: 18 [IU]/L (ref 0–32)
AST: 21 [IU]/L (ref 0–40)
Albumin: 4.7 g/dL (ref 3.9–4.9)
Alkaline Phosphatase: 48 [IU]/L (ref 44–121)
BUN/Creatinine Ratio: 25 — ABNORMAL HIGH (ref 9–23)
BUN: 16 mg/dL (ref 6–20)
Bilirubin Total: 0.4 mg/dL (ref 0.0–1.2)
CO2: 24 mmol/L (ref 20–29)
Calcium: 9.4 mg/dL (ref 8.7–10.2)
Chloride: 102 mmol/L (ref 96–106)
Creatinine, Ser: 0.64 mg/dL (ref 0.57–1.00)
Globulin, Total: 2.1 g/dL (ref 1.5–4.5)
Glucose: 83 mg/dL (ref 70–99)
Potassium: 4 mmol/L (ref 3.5–5.2)
Sodium: 140 mmol/L (ref 134–144)
Total Protein: 6.8 g/dL (ref 6.0–8.5)
eGFR: 117 mL/min/{1.73_m2} (ref >59)

## 2023-04-11 LAB — TSH+FREE T4
Free T4: 1.21 ng/dL (ref 0.82–1.77)
TSH: 0.724 u[IU]/mL (ref 0.450–4.500)

## 2023-04-11 LAB — LIPID PANEL
Chol/HDL Ratio: 2.2 ratio (ref 0.0–4.4)
Cholesterol, Total: 189 mg/dL (ref 100–199)
HDL: 87 mg/dL (ref >39)
LDL Chol Calc (NIH): 95 mg/dL (ref 0–99)
Triglycerides: 32 mg/dL (ref 0–149)
VLDL Cholesterol Cal: 7 mg/dL (ref 5–40)

## 2023-06-20 ENCOUNTER — Other Ambulatory Visit: Payer: Self-pay | Admitting: Medical Genetics

## 2023-06-26 ENCOUNTER — Other Ambulatory Visit (HOSPITAL_COMMUNITY)
Admission: RE | Admit: 2023-06-26 | Discharge: 2023-06-26 | Disposition: A | Discharge status: Home / Self Care | Payer: Self-pay | Source: Ambulatory Visit | Attending: Oncology | Admitting: Oncology

## 2023-07-06 ENCOUNTER — Other Ambulatory Visit (HOSPITAL_COMMUNITY): Payer: Self-pay

## 2023-07-06 LAB — GENECONNECT MOLECULAR SCREEN: Genetic Analysis Overall Interpretation: NEGATIVE

## 2023-08-16 DIAGNOSIS — F32A Depression, unspecified: Secondary | ICD-10-CM | POA: Diagnosis not present

## 2023-08-24 DIAGNOSIS — F32A Depression, unspecified: Secondary | ICD-10-CM | POA: Diagnosis not present

## 2023-08-31 DIAGNOSIS — F32A Depression, unspecified: Secondary | ICD-10-CM | POA: Diagnosis not present

## 2023-09-06 ENCOUNTER — Other Ambulatory Visit (HOSPITAL_COMMUNITY): Payer: Self-pay

## 2023-09-06 ENCOUNTER — Other Ambulatory Visit: Payer: Self-pay

## 2023-09-06 DIAGNOSIS — F909 Attention-deficit hyperactivity disorder, unspecified type: Secondary | ICD-10-CM | POA: Diagnosis not present

## 2023-09-06 DIAGNOSIS — F32A Depression, unspecified: Secondary | ICD-10-CM | POA: Diagnosis not present

## 2023-09-06 MED ORDER — ATOMOXETINE HCL 18 MG PO CAPS
ORAL_CAPSULE | ORAL | 0 refills | Status: DC
  Filled 2023-09-06: qty 60, 37d supply, fill #0

## 2023-09-07 ENCOUNTER — Other Ambulatory Visit (HOSPITAL_COMMUNITY): Payer: Self-pay

## 2023-09-07 ENCOUNTER — Other Ambulatory Visit: Payer: Self-pay

## 2023-09-14 DIAGNOSIS — F909 Attention-deficit hyperactivity disorder, unspecified type: Secondary | ICD-10-CM | POA: Diagnosis not present

## 2023-09-14 DIAGNOSIS — F32A Depression, unspecified: Secondary | ICD-10-CM | POA: Diagnosis not present

## 2023-10-03 ENCOUNTER — Other Ambulatory Visit (HOSPITAL_COMMUNITY): Payer: Self-pay

## 2023-10-03 DIAGNOSIS — F32A Depression, unspecified: Secondary | ICD-10-CM | POA: Diagnosis not present

## 2023-10-03 DIAGNOSIS — F3281 Premenstrual dysphoric disorder: Secondary | ICD-10-CM | POA: Diagnosis not present

## 2023-10-03 DIAGNOSIS — F909 Attention-deficit hyperactivity disorder, unspecified type: Secondary | ICD-10-CM | POA: Diagnosis not present

## 2023-10-03 MED ORDER — SERTRALINE HCL 50 MG PO TABS
ORAL_TABLET | ORAL | 0 refills | Status: DC
  Filled 2023-10-03: qty 30, 33d supply, fill #0

## 2023-10-03 MED ORDER — ATOMOXETINE HCL 25 MG PO CAPS
50.0000 mg | ORAL_CAPSULE | Freq: Every day | ORAL | 0 refills | Status: DC
  Filled 2023-10-03: qty 60, 30d supply, fill #0

## 2023-10-10 DIAGNOSIS — F32A Depression, unspecified: Secondary | ICD-10-CM | POA: Diagnosis not present

## 2023-11-07 ENCOUNTER — Other Ambulatory Visit (HOSPITAL_COMMUNITY): Payer: Self-pay

## 2023-11-07 ENCOUNTER — Other Ambulatory Visit: Payer: Self-pay

## 2023-11-07 DIAGNOSIS — F909 Attention-deficit hyperactivity disorder, unspecified type: Secondary | ICD-10-CM | POA: Diagnosis not present

## 2023-11-07 DIAGNOSIS — F3281 Premenstrual dysphoric disorder: Secondary | ICD-10-CM | POA: Diagnosis not present

## 2023-11-07 DIAGNOSIS — F32A Depression, unspecified: Secondary | ICD-10-CM | POA: Diagnosis not present

## 2023-11-07 MED ORDER — ATOMOXETINE HCL 60 MG PO CAPS
60.0000 mg | ORAL_CAPSULE | Freq: Every day | ORAL | 0 refills | Status: DC
  Filled 2023-11-07: qty 30, 30d supply, fill #0

## 2023-11-07 MED ORDER — SERTRALINE HCL 50 MG PO TABS
50.0000 mg | ORAL_TABLET | Freq: Every day | ORAL | 0 refills | Status: DC
  Filled 2023-11-07: qty 30, 30d supply, fill #0

## 2023-12-01 DIAGNOSIS — F32A Depression, unspecified: Secondary | ICD-10-CM | POA: Diagnosis not present

## 2023-12-06 ENCOUNTER — Other Ambulatory Visit (HOSPITAL_COMMUNITY): Payer: Self-pay

## 2023-12-06 DIAGNOSIS — F3281 Premenstrual dysphoric disorder: Secondary | ICD-10-CM | POA: Diagnosis not present

## 2023-12-06 DIAGNOSIS — F909 Attention-deficit hyperactivity disorder, unspecified type: Secondary | ICD-10-CM | POA: Diagnosis not present

## 2023-12-06 DIAGNOSIS — F32A Depression, unspecified: Secondary | ICD-10-CM | POA: Diagnosis not present

## 2023-12-06 MED ORDER — SERTRALINE HCL 100 MG PO TABS
100.0000 mg | ORAL_TABLET | Freq: Every day | ORAL | 1 refills | Status: DC
  Filled 2023-12-06: qty 90, 90d supply, fill #0

## 2023-12-06 MED ORDER — ATOMOXETINE HCL 80 MG PO CAPS
80.0000 mg | ORAL_CAPSULE | Freq: Every day | ORAL | 1 refills | Status: DC
  Filled 2023-12-06: qty 30, 30d supply, fill #0

## 2023-12-13 DIAGNOSIS — F32A Depression, unspecified: Secondary | ICD-10-CM | POA: Diagnosis not present

## 2023-12-13 DIAGNOSIS — F909 Attention-deficit hyperactivity disorder, unspecified type: Secondary | ICD-10-CM | POA: Diagnosis not present

## 2023-12-13 DIAGNOSIS — F3281 Premenstrual dysphoric disorder: Secondary | ICD-10-CM | POA: Diagnosis not present

## 2023-12-25 DIAGNOSIS — F32A Depression, unspecified: Secondary | ICD-10-CM | POA: Diagnosis not present

## 2023-12-25 DIAGNOSIS — F3281 Premenstrual dysphoric disorder: Secondary | ICD-10-CM | POA: Diagnosis not present

## 2023-12-25 DIAGNOSIS — F909 Attention-deficit hyperactivity disorder, unspecified type: Secondary | ICD-10-CM | POA: Diagnosis not present

## 2024-01-08 ENCOUNTER — Encounter: Payer: Self-pay | Admitting: Adult Health

## 2024-01-08 ENCOUNTER — Other Ambulatory Visit: Payer: Self-pay

## 2024-01-08 ENCOUNTER — Ambulatory Visit: Admitting: Adult Health

## 2024-01-08 ENCOUNTER — Other Ambulatory Visit (HOSPITAL_COMMUNITY): Payer: Self-pay

## 2024-01-08 VITALS — BP 127/78 | HR 61 | Ht 65.0 in | Wt 152.5 lb

## 2024-01-08 DIAGNOSIS — R1032 Left lower quadrant pain: Secondary | ICD-10-CM | POA: Diagnosis not present

## 2024-01-08 DIAGNOSIS — R232 Flushing: Secondary | ICD-10-CM | POA: Diagnosis not present

## 2024-01-08 DIAGNOSIS — F32A Depression, unspecified: Secondary | ICD-10-CM | POA: Diagnosis not present

## 2024-01-08 DIAGNOSIS — F3281 Premenstrual dysphoric disorder: Secondary | ICD-10-CM | POA: Diagnosis not present

## 2024-01-08 DIAGNOSIS — F909 Attention-deficit hyperactivity disorder, unspecified type: Secondary | ICD-10-CM | POA: Diagnosis not present

## 2024-01-08 DIAGNOSIS — R4589 Other symptoms and signs involving emotional state: Secondary | ICD-10-CM | POA: Diagnosis not present

## 2024-01-08 DIAGNOSIS — R61 Generalized hyperhidrosis: Secondary | ICD-10-CM

## 2024-01-08 DIAGNOSIS — Z1331 Encounter for screening for depression: Secondary | ICD-10-CM | POA: Diagnosis not present

## 2024-01-08 DIAGNOSIS — F419 Anxiety disorder, unspecified: Secondary | ICD-10-CM | POA: Diagnosis not present

## 2024-01-08 MED ORDER — LO LOESTRIN FE 1 MG-10 MCG / 10 MCG PO TABS
1.0000 | ORAL_TABLET | Freq: Every day | ORAL | 3 refills | Status: DC
Start: 2024-01-08 — End: 2024-04-10
  Filled 2024-01-08: qty 84, 84d supply, fill #0

## 2024-01-08 NOTE — Progress Notes (Signed)
 Subjective:     Patient ID: Paige Johnson, female   DOB: [birth year], 38 y.o.   MRN: [id number]  HPI Paige Johnson is a 38 year old white female,married, [id number] in complaining of night sweats, hot flashes, moody and periods last 2 days. She is having LLQ pain/camping before periods and pain with sex at times too.     Component Value Date/Time   DIAGPAP  03/14/2022 1611    - Negative for intraepithelial lesion or malignancy (NILM)   DIAGPAP  08/11/2019 1529    - Negative for intraepithelial lesion or malignancy (NILM)   HPVHIGH Negative 03/14/2022 1611   HPVHIGH Negative 08/11/2019 1529   ADEQPAP  03/14/2022 1611    Satisfactory for evaluation; transformation zone component PRESENT.   ADEQPAP  08/11/2019 1529    Satisfactory for evaluation; transformation zone component PRESENT.    Review of Systems +night sweats,  +hot flashes,  +moody  periods last 2 days + LLQ pain/camping before periods  + pain with sex at times too. Denies MI,stroke,DVT, breast cancer or migraine with auras    Reviewed past medical,surgical, social and family history. Reviewed medications and allergies.  Objective:   Physical Exam BP 127/78 (BP Location: Right Arm, Patient Position: Sitting, Cuff Size: Normal)   Pulse 61   Ht 5' 5 (1.651 m)   Wt 152 lb 8 oz (69.2 kg)   LMP 12/21/2023 (Exact Date)   BMI 25.38 kg/m     Skin warm and dry.. Lungs: clear to ausculation bilaterally. Cardiovascular: regular rate and rhythm.  Fall risk is low    01/08/2024   11:41 AM 04/10/2023    3:59 PM 05/30/2022    4:21 PM  Depression screen PHQ 2/9  Decreased Interest 0 1 2  Down, Depressed, Hopeless 2 2 2   PHQ - 2 Score 2 3 4   Altered sleeping 1 2 0  Tired, decreased energy 3 2 3   Change in appetite 0 0 0  Feeling bad or failure about yourself  1 3 2   Trouble concentrating 1 2 2   Moving slowly or fidgety/restless 0 0 0  Suicidal thoughts 0 0 0  PHQ-9 Score 8 12 11   Difficult doing work/chores Not difficult at  all         01/08/2024   11:44 AM 04/10/2023    3:59 PM 05/30/2022    4:23 PM 04/25/2022    4:26 PM  GAD 7 : Generalized Anxiety Score  Nervous, Anxious, on Edge 1 3 1 1   Control/stop worrying 3 3 2 1   Worry too much - different things 2 3 2 1   Trouble relaxing 2 1 2 1   Restless 0 1 0 0  Easily annoyed or irritable 3 3 3 2   Afraid - awful might happen 2 2 0 0  Total GAD 7 Score 13 16 10 6       Upstream - 01/08/24 1140       Pregnancy Intention Screening   Does the patient want to become pregnant in the next year? No    Does the patient's partner want to become pregnant in the next year? No    Would the patient like to discuss contraceptive options today? No      Contraception Wrap Up   Current Method Vasectomy    End Method Vasectomy          Assessment:      1. Anxiety and depression Takes zoloft  100 mg 1 daily, has refills  2. Moody  3. Night  sweats (Primary) Has night sweats Will try lo dose COC, will rx Lo Loestrin , can start now to see if helps with night sweats, hot flashes and moods, could be perimenopause Meds ordered this encounter  Medications   Norethindrone-Ethinyl Estradiol -Fe Biphas (LO LOESTRIN FE ) 1 MG-10 MCG / 10 MCG tablet    Sig: Take 1 tablet by mouth daily.    Dispense:  84 tablet    Refill:  3    BIN [id number], PCN [id number], GRP [id number]    Supervising Provider:   JAYNE MINDER H [[id number]]     4. LLQ pain Has LLQ pain/cramps before periods and pain with sex at times, can get hot and feel nausea too Will get US  to assess uterus and ovaries at [facility] 01/16/24 at 3:30 pm  - US  PELVIC COMPLETE WITH TRANSVAGINAL; Future  5. Hot flashes +hot flashes    Plan:     Return about 04/10/24 for physical and labs

## 2024-01-16 ENCOUNTER — Encounter (HOSPITAL_COMMUNITY): Payer: Self-pay

## 2024-01-16 ENCOUNTER — Ambulatory Visit (HOSPITAL_COMMUNITY)

## 2024-01-23 DIAGNOSIS — F32A Depression, unspecified: Secondary | ICD-10-CM | POA: Diagnosis not present

## 2024-01-23 DIAGNOSIS — F3281 Premenstrual dysphoric disorder: Secondary | ICD-10-CM | POA: Diagnosis not present

## 2024-01-23 DIAGNOSIS — F909 Attention-deficit hyperactivity disorder, unspecified type: Secondary | ICD-10-CM | POA: Diagnosis not present

## 2024-01-24 ENCOUNTER — Other Ambulatory Visit (HOSPITAL_COMMUNITY): Payer: Self-pay

## 2024-01-24 ENCOUNTER — Other Ambulatory Visit: Payer: Self-pay

## 2024-01-24 ENCOUNTER — Other Ambulatory Visit (HOSPITAL_BASED_OUTPATIENT_CLINIC_OR_DEPARTMENT_OTHER): Payer: Self-pay

## 2024-01-24 DIAGNOSIS — F32A Depression, unspecified: Secondary | ICD-10-CM | POA: Diagnosis not present

## 2024-01-24 DIAGNOSIS — F909 Attention-deficit hyperactivity disorder, unspecified type: Secondary | ICD-10-CM | POA: Diagnosis not present

## 2024-01-24 DIAGNOSIS — F3281 Premenstrual dysphoric disorder: Secondary | ICD-10-CM | POA: Diagnosis not present

## 2024-01-24 MED ORDER — SERTRALINE HCL 100 MG PO TABS
100.0000 mg | ORAL_TABLET | Freq: Every day | ORAL | 1 refills | Status: DC
  Filled 2024-01-24: qty 30, 30d supply, fill #0

## 2024-01-24 MED ORDER — ATOMOXETINE HCL 60 MG PO CAPS
60.0000 mg | ORAL_CAPSULE | Freq: Every morning | ORAL | 1 refills | Status: DC
  Filled 2024-01-24: qty 30, 30d supply, fill #0

## 2024-02-06 DIAGNOSIS — F909 Attention-deficit hyperactivity disorder, unspecified type: Secondary | ICD-10-CM | POA: Diagnosis not present

## 2024-02-06 DIAGNOSIS — F3281 Premenstrual dysphoric disorder: Secondary | ICD-10-CM | POA: Diagnosis not present

## 2024-02-06 DIAGNOSIS — F32A Depression, unspecified: Secondary | ICD-10-CM | POA: Diagnosis not present

## 2024-02-21 DIAGNOSIS — F909 Attention-deficit hyperactivity disorder, unspecified type: Secondary | ICD-10-CM | POA: Diagnosis not present

## 2024-02-21 DIAGNOSIS — F3281 Premenstrual dysphoric disorder: Secondary | ICD-10-CM | POA: Diagnosis not present

## 2024-02-21 DIAGNOSIS — F32A Depression, unspecified: Secondary | ICD-10-CM | POA: Diagnosis not present

## 2024-03-06 DIAGNOSIS — F32A Depression, unspecified: Secondary | ICD-10-CM | POA: Diagnosis not present

## 2024-03-06 DIAGNOSIS — F909 Attention-deficit hyperactivity disorder, unspecified type: Secondary | ICD-10-CM | POA: Diagnosis not present

## 2024-03-06 DIAGNOSIS — F3281 Premenstrual dysphoric disorder: Secondary | ICD-10-CM | POA: Diagnosis not present

## 2024-03-19 DIAGNOSIS — F909 Attention-deficit hyperactivity disorder, unspecified type: Secondary | ICD-10-CM | POA: Diagnosis not present

## 2024-03-19 DIAGNOSIS — F32A Depression, unspecified: Secondary | ICD-10-CM | POA: Diagnosis not present

## 2024-03-20 ENCOUNTER — Other Ambulatory Visit (HOSPITAL_COMMUNITY): Payer: Self-pay

## 2024-03-20 ENCOUNTER — Other Ambulatory Visit: Payer: Self-pay

## 2024-03-20 DIAGNOSIS — F3281 Premenstrual dysphoric disorder: Secondary | ICD-10-CM | POA: Diagnosis not present

## 2024-03-20 DIAGNOSIS — F909 Attention-deficit hyperactivity disorder, unspecified type: Secondary | ICD-10-CM | POA: Diagnosis not present

## 2024-03-20 DIAGNOSIS — F32A Depression, unspecified: Secondary | ICD-10-CM | POA: Diagnosis not present

## 2024-03-20 MED ORDER — ATOMOXETINE HCL 18 MG PO CAPS
ORAL_CAPSULE | ORAL | 0 refills | Status: AC
  Filled 2024-03-20: qty 180, 90d supply, fill #0

## 2024-03-20 MED ORDER — SERTRALINE HCL 100 MG PO TABS
100.0000 mg | ORAL_TABLET | Freq: Every day | ORAL | 0 refills | Status: AC
  Filled 2024-03-20: qty 90, 90d supply, fill #0

## 2024-04-02 DIAGNOSIS — F909 Attention-deficit hyperactivity disorder, unspecified type: Secondary | ICD-10-CM | POA: Diagnosis not present

## 2024-04-02 DIAGNOSIS — F3281 Premenstrual dysphoric disorder: Secondary | ICD-10-CM | POA: Diagnosis not present

## 2024-04-02 DIAGNOSIS — F32A Depression, unspecified: Secondary | ICD-10-CM | POA: Diagnosis not present

## 2024-04-10 ENCOUNTER — Encounter: Payer: Self-pay | Admitting: Adult Health

## 2024-04-10 ENCOUNTER — Ambulatory Visit: Admitting: Adult Health

## 2024-04-10 VITALS — BP 117/77 | HR 59 | Ht 65.0 in | Wt 155.0 lb

## 2024-04-10 DIAGNOSIS — Z1329 Encounter for screening for other suspected endocrine disorder: Secondary | ICD-10-CM | POA: Diagnosis not present

## 2024-04-10 DIAGNOSIS — Z1331 Encounter for screening for depression: Secondary | ICD-10-CM | POA: Diagnosis not present

## 2024-04-10 DIAGNOSIS — Z13220 Encounter for screening for lipoid disorders: Secondary | ICD-10-CM

## 2024-04-10 DIAGNOSIS — R61 Generalized hyperhidrosis: Secondary | ICD-10-CM | POA: Diagnosis not present

## 2024-04-10 DIAGNOSIS — N941 Unspecified dyspareunia: Secondary | ICD-10-CM

## 2024-04-10 DIAGNOSIS — R1032 Left lower quadrant pain: Secondary | ICD-10-CM | POA: Diagnosis not present

## 2024-04-10 DIAGNOSIS — Z01419 Encounter for gynecological examination (general) (routine) without abnormal findings: Secondary | ICD-10-CM

## 2024-04-10 DIAGNOSIS — N946 Dysmenorrhea, unspecified: Secondary | ICD-10-CM | POA: Diagnosis not present

## 2024-04-10 NOTE — Progress Notes (Signed)
 Patient ID: Paige Johnson, female   DOB: [birth year], 38 y.o.   MRN: [id number] History of Present Illness: Paige Johnson is a 38 year old white female, married, [id number] in for a well woman gyn exam. She stopped lo Loestrin  after a month, was emotional. She still has hot flashes before period esp at night and pain with period and with sex, and other times too. Sometimes will feel hot and have nausea after sex. She is going back to school for nursing.     Component Value Date/Time   DIAGPAP  03/14/2022 1611    - Negative for intraepithelial lesion or malignancy (NILM)   DIAGPAP  08/11/2019 1529    - Negative for intraepithelial lesion or malignancy (NILM)   HPVHIGH Negative 03/14/2022 1611   HPVHIGH Negative 08/11/2019 1529   ADEQPAP  03/14/2022 1611    Satisfactory for evaluation; transformation zone component PRESENT.   ADEQPAP  08/11/2019 1529    Satisfactory for evaluation; transformation zone component PRESENT.      Current Medications, Allergies, Past Medical History, Past Surgical History, Family History and Social History were reviewed in [employer] record.     Review of Systems: Patient denies any headaches, hearing loss, fatigue, blurred vision, shortness of breath, chest pain, abdominal pain, problems with bowel movements, or urination. No joint pain or mood swings. Periods are regular. See HPI for positives   Physical Exam:BP 117/77 (BP Location: Right Arm, Patient Position: Sitting, Cuff Size: Normal)   Pulse (!) 59   Ht 5' 5 (1.651 m)   Wt 155 lb (70.3 kg)   LMP 03/31/2024 (Exact Date)   BMI 25.79 kg/m   General:  Well developed, well nourished, no acute distress Skin:  Warm and dry Neck:  Midline trachea, normal thyroid , good ROM, no lymphadenopathy Lungs; Clear to auscultation bilaterally Breast:  No dominant palpable mass, retraction, or nipple discharge, has bilateral breast implants  Cardiovascular: Regular rate and rhythm Abdomen:  Soft,  non tender, no hepatosplenomegaly Pelvic:  External genitalia is normal in appearance, no lesions.  The vagina is normal in appearance. Urethra has no lesions or masses. The cervix is bulbous.  Uterus is felt to be normal size, shape, and contour.  No adnexal masses or tenderness noted.Bladder is non tender, no masses felt. Extremities/musculoskeletal:  No swelling or varicosities noted, no clubbing or cyanosis Psych:  No mood changes, alert and cooperative,seems happy AA is 1 Fall risk is low    04/10/2024   10:00 AM 01/08/2024   11:41 AM 04/10/2023    3:59 PM  Depression screen PHQ 2/9  Decreased Interest 1 0 1  Down, Depressed, Hopeless 2 2 2   PHQ - 2 Score 3 2 3   Altered sleeping 2 1 2   Tired, decreased energy 2 3 2   Change in appetite 0 0 0  Feeling bad or failure about yourself  3 1 3   Trouble concentrating 3 1 2   Moving slowly or fidgety/restless 0 0 0  Suicidal thoughts 0 0 0  PHQ-9 Score 13 8 12   Difficult doing work/chores  Not difficult at all    On zoloft  50 mg but increasing to 100 mg soon, talks with psychiatrist through telehealth     04/10/2024   10:01 AM 01/08/2024   11:44 AM 04/10/2023    3:59 PM 05/30/2022    4:23 PM  GAD 7 : Generalized Anxiety Score  Nervous, Anxious, on Edge 2 1 3 1   Control/stop worrying 2 3 3 2   Worry  too much - different things 2 2 3 2   Trouble relaxing 1 2 1 2   Restless 0 0 1 0  Easily annoyed or irritable 3 3 3 3   Afraid - awful might happen 2 2 2  0  Total GAD 7 Score 12 13 16 10     Upstream - 04/10/24 0958       Pregnancy Intention Screening   Does the patient want to become pregnant in the next year? No    Does the patient's partner want to become pregnant in the next year? No    Would the patient like to discuss contraceptive options today? No      Contraception Wrap Up   Current Method Vasectomy    End Method Vasectomy    Contraception Counseling Provided No           Examination chaperoned by Clarita Salt  LPN   Impression and Plan: 1. Encounter for well woman exam with routine gynecological exam (Primary) Pap and physical in 1 year Will check labs Mammogram at 40  - CBC - Comprehensive metabolic panel with GFR - Lipid panel  2. LLQ pain Has more pain to the left Will get US  to assess uterus and ovaries in office  - US  PELVIC COMPLETE WITH TRANSVAGINAL; Future  3. Night sweats - TSH + free T4  4. Dyspareunia, female Has pain with sex, seems worse with nausea and hot feeling   5. Dysmenorrhea +pain with with periods Will get US  on office to assess uterus and ovaries in about 2 weeks  - US  PELVIC COMPLETE WITH TRANSVAGINAL; Future I gave her handout on [facility] to read, as she wonders if has endometriosis, others in her family have it  6. Screening cholesterol level - Lipid panel  7. Screening for thyroid  disorder - TSH + free T4

## 2024-04-11 ENCOUNTER — Ambulatory Visit: Payer: Self-pay | Admitting: Adult Health

## 2024-04-11 LAB — LIPID PANEL
Chol/HDL Ratio: 2.2 ratio (ref 0.0–4.4)
Cholesterol, Total: 176 mg/dL (ref 100–199)
HDL: 79 mg/dL (ref >39)
LDL Chol Calc (NIH): 90 mg/dL (ref 0–99)
Triglycerides: 29 mg/dL (ref 0–149)
VLDL Cholesterol Cal: 7 mg/dL (ref 5–40)

## 2024-04-11 LAB — COMPREHENSIVE METABOLIC PANEL WITH GFR
ALT: 25 IU/L (ref 0–32)
AST: 25 IU/L (ref 0–40)
Albumin: 4.8 g/dL (ref 3.9–4.9)
Alkaline Phosphatase: 56 IU/L (ref 41–116)
BUN/Creatinine Ratio: 19 (ref 9–23)
BUN: 14 mg/dL (ref 6–20)
Bilirubin Total: 0.5 mg/dL (ref 0.0–1.2)
CO2: 22 mmol/L (ref 20–29)
Calcium: 10 mg/dL (ref 8.7–10.2)
Chloride: 102 mmol/L (ref 96–106)
Creatinine, Ser: 0.73 mg/dL (ref 0.57–1.00)
Globulin, Total: 2 g/dL (ref 1.5–4.5)
Glucose: 82 mg/dL (ref 70–99)
Potassium: 4.4 mmol/L (ref 3.5–5.2)
Sodium: 140 mmol/L (ref 134–144)
Total Protein: 6.8 g/dL (ref 6.0–8.5)
eGFR: 109 mL/min/1.73 (ref >59)

## 2024-04-11 LAB — CBC
Hematocrit: 40.8 % (ref 34.0–46.6)
Hemoglobin: 13.5 g/dL (ref 11.1–15.9)
MCH: 32.1 pg (ref 26.6–33.0)
MCHC: 33.1 g/dL (ref 31.5–35.7)
MCV: 97 fL (ref 79–97)
Platelets: 319 x10E3/uL (ref 150–450)
RBC: 4.2 x10E6/uL (ref 3.77–5.28)
RDW: 11.9 % (ref 11.7–15.4)
WBC: 4 x10E3/uL (ref 3.4–10.8)

## 2024-04-11 LAB — TSH+FREE T4
Free T4: 1.22 ng/dL (ref 0.82–1.77)
TSH: 0.693 u[IU]/mL (ref 0.450–4.500)

## 2024-04-16 DIAGNOSIS — F909 Attention-deficit hyperactivity disorder, unspecified type: Secondary | ICD-10-CM | POA: Diagnosis not present

## 2024-04-16 DIAGNOSIS — F3281 Premenstrual dysphoric disorder: Secondary | ICD-10-CM | POA: Diagnosis not present

## 2024-04-16 DIAGNOSIS — F32A Depression, unspecified: Secondary | ICD-10-CM | POA: Diagnosis not present

## 2024-04-22 ENCOUNTER — Ambulatory Visit: Admitting: Radiology

## 2024-04-22 DIAGNOSIS — R1032 Left lower quadrant pain: Secondary | ICD-10-CM | POA: Diagnosis not present

## 2024-04-22 DIAGNOSIS — N946 Dysmenorrhea, unspecified: Secondary | ICD-10-CM | POA: Diagnosis not present

## 2024-04-22 NOTE — Progress Notes (Signed)
 GYN US : TA and TV imaging performed - vinyl probe cover used - Chaperone: Tish Anteverted uterus normal in size, symmetrical, homogeneous myometrium, no focal abn seen Endom thickness = 10.8 mm, uniform avascular cavity and canal, no evidence of intracavitary defects Ovaries appear normal in size,  Rt ov with small collapsed corpus luteal = 21 x 20 mm.  The ovaries appear mobile, neg adnexal regions, neg CDS, tiny amount of clear free fluid present

## 2024-04-30 ENCOUNTER — Other Ambulatory Visit: Payer: Self-pay

## 2024-04-30 ENCOUNTER — Telehealth: Admitting: Physician Assistant

## 2024-04-30 ENCOUNTER — Encounter: Payer: Self-pay | Admitting: Pharmacist

## 2024-04-30 ENCOUNTER — Other Ambulatory Visit (HOSPITAL_COMMUNITY): Payer: Self-pay

## 2024-04-30 DIAGNOSIS — J069 Acute upper respiratory infection, unspecified: Secondary | ICD-10-CM | POA: Diagnosis not present

## 2024-04-30 MED ORDER — LIDOCAINE VISCOUS HCL 2 % MT SOLN
5.0000 mL | Freq: Four times a day (QID) | OROMUCOSAL | 0 refills | Status: AC | PRN
  Filled 2024-04-30: qty 100, 3d supply, fill #0

## 2024-04-30 MED ORDER — FLUTICASONE PROPIONATE 50 MCG/ACT NA SUSP
2.0000 | Freq: Every day | NASAL | 0 refills | Status: AC
  Filled 2024-04-30: qty 16, 30d supply, fill #0

## 2024-04-30 NOTE — Progress Notes (Signed)

## 2024-05-01 DIAGNOSIS — F3281 Premenstrual dysphoric disorder: Secondary | ICD-10-CM | POA: Diagnosis not present

## 2024-05-01 DIAGNOSIS — F909 Attention-deficit hyperactivity disorder, unspecified type: Secondary | ICD-10-CM | POA: Diagnosis not present

## 2024-05-01 DIAGNOSIS — F32A Depression, unspecified: Secondary | ICD-10-CM | POA: Diagnosis not present

## 2024-05-05 ENCOUNTER — Other Ambulatory Visit: Payer: Self-pay

## 2024-05-15 DIAGNOSIS — F32A Depression, unspecified: Secondary | ICD-10-CM | POA: Diagnosis not present

## 2024-05-15 DIAGNOSIS — F3281 Premenstrual dysphoric disorder: Secondary | ICD-10-CM | POA: Diagnosis not present

## 2024-05-15 DIAGNOSIS — F909 Attention-deficit hyperactivity disorder, unspecified type: Secondary | ICD-10-CM | POA: Diagnosis not present

## 2024-06-20 ENCOUNTER — Other Ambulatory Visit (HOSPITAL_COMMUNITY): Payer: Self-pay

## 2024-06-20 ENCOUNTER — Other Ambulatory Visit: Payer: Self-pay

## 2024-06-20 MED ORDER — ATOMOXETINE HCL 18 MG PO CAPS
ORAL_CAPSULE | ORAL | 0 refills | Status: AC
  Filled 2024-06-20: qty 60, 30d supply, fill #0

## 2024-06-20 MED ORDER — SERTRALINE HCL 100 MG PO TABS
100.0000 mg | ORAL_TABLET | Freq: Every day | ORAL | 0 refills | Status: AC
  Filled 2024-06-20: qty 30, 30d supply, fill #0

## 2024-06-23 ENCOUNTER — Encounter: Payer: Self-pay | Admitting: Pharmacist

## 2024-06-23 ENCOUNTER — Other Ambulatory Visit: Payer: Self-pay

## 2024-06-26 ENCOUNTER — Other Ambulatory Visit: Payer: Self-pay
